# Patient Record
Sex: Female | Born: 1985
Health system: Southern US, Community
[De-identification: ages and names within clinical notes are randomized; demographics above are authoritative.]

## PROBLEM LIST (undated history)

## (undated) ENCOUNTER — Ambulatory Visit (HOSPITAL_COMMUNITY): Admission: EM | Payer: BC Managed Care – PPO

## (undated) ENCOUNTER — Inpatient Hospital Stay (HOSPITAL_COMMUNITY): Payer: Self-pay

## (undated) DIAGNOSIS — F32A Depression, unspecified: Secondary | ICD-10-CM

## (undated) DIAGNOSIS — F329 Major depressive disorder, single episode, unspecified: Secondary | ICD-10-CM

## (undated) DIAGNOSIS — L309 Dermatitis, unspecified: Secondary | ICD-10-CM

## (undated) DIAGNOSIS — Q318 Other congenital malformations of larynx: Secondary | ICD-10-CM

## (undated) DIAGNOSIS — A6000 Herpesviral infection of urogenital system, unspecified: Secondary | ICD-10-CM

## (undated) DIAGNOSIS — D219 Benign neoplasm of connective and other soft tissue, unspecified: Secondary | ICD-10-CM

## (undated) DIAGNOSIS — G4719 Other hypersomnia: Secondary | ICD-10-CM

## (undated) DIAGNOSIS — F419 Anxiety disorder, unspecified: Secondary | ICD-10-CM

## (undated) DIAGNOSIS — K219 Gastro-esophageal reflux disease without esophagitis: Secondary | ICD-10-CM

## (undated) DIAGNOSIS — J45909 Unspecified asthma, uncomplicated: Secondary | ICD-10-CM

## (undated) DIAGNOSIS — F319 Bipolar disorder, unspecified: Secondary | ICD-10-CM

## (undated) HISTORY — DX: Other hypersomnia: G47.19

## (undated) HISTORY — DX: Major depressive disorder, single episode, unspecified: F32.9

## (undated) HISTORY — DX: Depression, unspecified: F32.A

## (undated) HISTORY — DX: Benign neoplasm of connective and other soft tissue, unspecified: D21.9

## (undated) HISTORY — DX: Bipolar disorder, unspecified: F31.9

## (undated) HISTORY — DX: Herpesviral infection of urogenital system, unspecified: A60.00

## (undated) HISTORY — PX: NO PAST SURGERIES: SHX2092

## (undated) HISTORY — DX: Anxiety disorder, unspecified: F41.9

---

## 2013-03-15 ENCOUNTER — Other Ambulatory Visit (HOSPITAL_COMMUNITY)
Admission: RE | Admit: 2013-03-15 | Discharge: 2013-03-15 | Disposition: A | Discharge status: Home / Self Care | Payer: BC Managed Care – PPO | Source: Ambulatory Visit | Attending: Obstetrics & Gynecology | Admitting: Obstetrics & Gynecology

## 2013-03-15 DIAGNOSIS — Z113 Encounter for screening for infections with a predominantly sexual mode of transmission: Secondary | ICD-10-CM | POA: Insufficient documentation

## 2013-03-15 DIAGNOSIS — Z01419 Encounter for gynecological examination (general) (routine) without abnormal findings: Secondary | ICD-10-CM | POA: Insufficient documentation

## 2014-04-08 ENCOUNTER — Other Ambulatory Visit (HOSPITAL_COMMUNITY): Payer: BC Managed Care – PPO

## 2014-04-11 ENCOUNTER — Other Ambulatory Visit (HOSPITAL_COMMUNITY): Payer: Self-pay | Admitting: Family Medicine

## 2014-04-11 ENCOUNTER — Encounter (HOSPITAL_COMMUNITY): Payer: Self-pay | Admitting: *Deleted

## 2014-04-11 ENCOUNTER — Ambulatory Visit (HOSPITAL_COMMUNITY): Payer: BC Managed Care – PPO | Attending: Family Medicine | Admitting: Radiology

## 2014-04-11 DIAGNOSIS — Q2112 Patent foramen ovale: Secondary | ICD-10-CM

## 2014-04-11 DIAGNOSIS — Q211 Atrial septal defect: Secondary | ICD-10-CM

## 2014-04-11 NOTE — Progress Notes (Signed)
Patient ID: Katie [name], female   DOB: [birth year], 28 y.o.   MRN: [id number] 22 G IV started in R hand x1, site un remarkable, for Bubble Study.   Crissie Figures, RN

## 2014-04-11 NOTE — Progress Notes (Signed)
Echocardiogram performed with Bubble Study to rule out PFO.

## 2015-10-30 ENCOUNTER — Ambulatory Visit (INDEPENDENT_AMBULATORY_CARE_PROVIDER_SITE_OTHER): Payer: 59 | Admitting: Gynecology

## 2015-10-30 ENCOUNTER — Encounter: Payer: Self-pay | Admitting: Gynecology

## 2015-10-30 VITALS — BP 120/82 | Ht 64.5 in | Wt 149.0 lb

## 2015-10-30 DIAGNOSIS — Z01419 Encounter for gynecological examination (general) (routine) without abnormal findings: Secondary | ICD-10-CM

## 2015-10-30 NOTE — Patient Instructions (Signed)
Breast Self-Awareness Practicing breast self-awareness may pick up problems early, prevent significant medical complications, and possibly save your life. By practicing breast self-awareness, you can become familiar with how your breasts look and feel and if your breasts are changing. This allows you to notice changes early. It can also offer you some reassurance that your breast health is good. One way to learn what is normal for your breasts and whether your breasts are changing is to do a breast self-exam. If you find a lump or something that was not present in the past, it is best to contact your caregiver right away. Other findings that should be evaluated by your caregiver include nipple discharge, especially if it is bloody; skin changes or reddening; areas where the skin seems to be pulled in (retracted); or new lumps and bumps. Breast pain is seldom associated with cancer (malignancy), but should also be evaluated by a caregiver. HOW TO PERFORM A BREAST SELF-EXAM The best time to examine your breasts is 5-7 days after your menstrual period is over. During menstruation, the breasts are lumpier, and it may be more difficult to pick up changes. If you do not menstruate, have reached menopause, or had your uterus removed (hysterectomy), you should examine your breasts at regular intervals, such as monthly. If you are breastfeeding, examine your breasts after a feeding or after using a breast pump. Breast implants do not decrease the risk for lumps or tumors, so continue to perform breast self-exams as recommended. Talk to your caregiver about how to determine the difference between the implant and breast tissue. Also, talk about the amount of pressure you should use during the exam. Over time, you will become more familiar with the variations of your breasts and more comfortable with the exam. A breast self-exam requires you to remove all your clothes above the waist. 1. Look at your breasts and nipples.  Stand in front of a mirror in a room with good lighting. With your hands on your hips, push your hands firmly downward. Look for a difference in shape, contour, and size from one breast to the other (asymmetry). Asymmetry includes puckers, dips, or bumps. Also, look for skin changes, such as reddened or scaly areas on the breasts. Look for nipple changes, such as discharge, dimpling, repositioning, or redness. 2. Carefully feel your breasts. This is best done either in the shower or tub while using soapy water or when flat on your back. Place the arm (on the side of the breast you are examining) above your head. Use the pads (not the fingertips) of your three middle fingers on your opposite hand to feel your breasts. Start in the underarm area and use  inch (2 cm) overlapping circles to feel your breast. Use 3 different levels of pressure (light, medium, and firm pressure) at each circle before moving to the next circle. The light pressure is needed to feel the tissue closest to the skin. The medium pressure will help to feel breast tissue a little deeper, while the firm pressure is needed to feel the tissue close to the ribs. Continue the overlapping circles, moving downward over the breast until you feel your ribs below your breast. Then, move one finger-width towards the center of the body. Continue to use the  inch (2 cm) overlapping circles to feel your breast as you move slowly up toward the collar bone (clavicle) near the base of the neck. Continue the up and down exam using all 3 pressures until you reach the   middle of the chest. Do this with each breast, carefully feeling for lumps or changes. 3.  Keep a written record with breast changes or normal findings for each breast. By writing this information down, you do not need to depend only on memory for size, tenderness, or location. Write down where you are in your menstrual cycle, if you are still menstruating. Breast tissue can have some lumps or  thick tissue. However, see your caregiver if you find anything that concerns you.  SEEK MEDICAL CARE IF:  You see a change in shape, contour, or size of your breasts or nipples.   You see skin changes, such as reddened or scaly areas on the breasts or nipples.   You have an unusual discharge from your nipples.   You feel a new lump or unusually thick areas.    This information is not intended to replace advice given to you by your health care provider. Make sure you discuss any questions you have with your health care provider.   Document Released: 04/19/2005 Document Revised: 04/05/2012 Document Reviewed: 08/04/2011 Elsevier Interactive Patient Education 2016 [employer]  

## 2015-10-30 NOTE — Progress Notes (Signed)
Katie [name] August 01, 1985 [id number]   History:    30 y.o.  for annual gyn exam who is a new patient to the practice. Patient was receiving her gynecological care in [facility]. Patient denies any past history of any abnormal Pap smear. Her last Pap smear was approximately 2 years ago. We have no records. Patient has never received the HPV vaccine series. She reports normal menstrual cycles every 30 days but they're last for 5 days the first 2 days are heavy with some cramping. She is now attempting to get pregnant. She is currently on prenatal vitamins. Patient had an ultrasound done in [place] in 2016 she was found to have 1 small intramural fibroid measuring 1.4 cm.  Past medical history,surgical history, family history and social history were all reviewed and documented in the EPIC chart.  Gynecologic History Patient's last menstrual period was 10/10/2015. Contraception: none Last Pap: Over 2 years ago.Marland Kitchen Results were: normal Last mammogram: Not indicated. Results were: Not indicated  Obstetric History OB History  Gravida Para Term Preterm AB SAB TAB Ectopic Multiple Living  _0         ROS: A ROS was performed and pertinent positives and negatives are included in the history.  GENERAL: No fevers or chills. HEENT: No change in vision, no earache, sore throat or sinus congestion. NECK: No pain or stiffness. CARDIOVASCULAR: No chest pain or pressure. No palpitations. PULMONARY: No shortness of breath, cough or wheeze. GASTROINTESTINAL: No abdominal pain, nausea, vomiting or diarrhea, melena or bright red blood per rectum. GENITOURINARY: No urinary frequency, urgency, hesitancy or dysuria. MUSCULOSKELETAL: No joint or muscle pain, no back pain, no recent trauma. DERMATOLOGIC: No rash, no itching, no lesions. ENDOCRINE: No polyuria, polydipsia, no heat or cold intolerance. No recent change in weight. HEMATOLOGICAL: No anemia or easy  bruising or bleeding. NEUROLOGIC: No headache, seizures, numbness, tingling or weakness. PSYCHIATRIC: No depression, no loss of interest in normal activity or change in sleep pattern.     Exam: chaperone present  BP 120/82 mmHg  Ht 5' 4.5" (1.638 m)  Wt 149 lb (67.586 kg)  BMI 25.19 kg/m2  LMP 10/10/2015  Body mass index is 25.19 kg/(m^2).  General appearance : Well developed well nourished female. No acute distress HEENT: Eyes: no retinal hemorrhage or exudates,  Neck supple, trachea midline, no carotid bruits, no thyroidmegaly Lungs: Clear to auscultation, no rhonchi or wheezes, or rib retractions  Heart: Regular rate and rhythm, no murmurs or gallops Breast:Examined in sitting and supine position were symmetrical in appearance, no palpable masses or tenderness,  no skin retraction, no nipple inversion, no nipple discharge, no skin discoloration, no axillary or supraclavicular lymphadenopathy Abdomen: no palpable masses or tenderness, no rebound or guarding Extremities: no edema or skin discoloration or tenderness  Pelvic:  Bartholin, Urethra, Skene Glands: Within normal limits             Vagina: No gross lesions or discharge  Cervix: No gross lesions or discharge  Uterus  anteverted, normal size, shape and consistency, non-tender and mobile  Adnexa  Without masses or tenderness  Anus and perineum  normal   Rectovaginal  normal sphincter tone without palpated masses or tenderness             Hemoccult not indicated     Assessment/Plan:  30 y.o. female for annual exam who is attempting to get pregnant. She has has not been using  any contraception for the past month. She is on prenatal vitamins. We discussed the utilization of ovulation predictor kit in an effort to time or intercourse. We also discussed importance of monthly breast exams. She will return to the office sometime next week in a fasting state for the following screening blood work: Comprehensive metabolic panel,  fasting lipid profile, CBC, and urinalysis. Pap smear with HPV screening was done today.   Terrance Mass MD, 3:01 PM 10/30/2015

## 2015-10-31 LAB — URINALYSIS W MICROSCOPIC + REFLEX CULTURE
BACTERIA UA: NONE SEEN [HPF]
BILIRUBIN URINE: NEGATIVE
Casts: NONE SEEN [LPF]
Crystals: NONE SEEN [HPF]
GLUCOSE, UA: NEGATIVE
HGB URINE DIPSTICK: NEGATIVE
LEUKOCYTES UA: NEGATIVE
Nitrite: NEGATIVE
PH: 6 (ref 5.0–8.0)
PROTEIN: NEGATIVE
RBC / HPF: NONE SEEN RBC/HPF (ref <=2)
SQUAMOUS EPITHELIAL / LPF: NONE SEEN [HPF] (ref <=5)
Specific Gravity, Urine: 1.023 (ref 1.001–1.035)
WBC UA: NONE SEEN WBC/HPF (ref <=5)
YEAST: NONE SEEN [HPF]

## 2015-11-03 LAB — PAP, TP IMAGING W/ HPV RNA, RFLX HPV TYPE 16,18/45: HPV mRNA, High Risk: NOT DETECTED

## 2015-11-07 ENCOUNTER — Other Ambulatory Visit: Payer: 59

## 2015-11-20 ENCOUNTER — Other Ambulatory Visit: Payer: 59

## 2015-11-20 LAB — COMPREHENSIVE METABOLIC PANEL
ALT: 9 U/L (ref 6–29)
AST: 13 U/L (ref 10–30)
Albumin: 3.9 g/dL (ref 3.6–5.1)
Alkaline Phosphatase: 63 U/L (ref 33–115)
BILIRUBIN TOTAL: 0.3 mg/dL (ref 0.2–1.2)
BUN: 10 mg/dL (ref 7–25)
CALCIUM: 8.9 mg/dL (ref 8.6–10.2)
CHLORIDE: 104 mmol/L (ref 98–110)
CO2: 26 mmol/L (ref 20–31)
Creat: 0.74 mg/dL (ref 0.50–1.10)
GLUCOSE: 93 mg/dL (ref 65–99)
POTASSIUM: 4.6 mmol/L (ref 3.5–5.3)
Sodium: 137 mmol/L (ref 135–146)
Total Protein: 6.7 g/dL (ref 6.1–8.1)

## 2015-11-20 LAB — CBC WITH DIFFERENTIAL/PLATELET
Basophils Absolute: 0 cells/uL (ref 0–200)
Basophils Relative: 0 %
Eosinophils Absolute: 0 cells/uL — ABNORMAL LOW (ref 15–500)
Eosinophils Relative: 0 %
HEMATOCRIT: 39.4 % (ref 35.0–45.0)
Hemoglobin: 13 g/dL (ref 11.7–15.5)
LYMPHS PCT: 30 %
Lymphs Abs: 2010 cells/uL (ref 850–3900)
MCH: 31.1 pg (ref 27.0–33.0)
MCHC: 33 g/dL (ref 32.0–36.0)
MCV: 94.3 fL (ref 80.0–100.0)
MONO ABS: 402 {cells}/uL (ref 200–950)
MPV: 9 fL (ref 7.5–12.5)
Monocytes Relative: 6 %
Neutro Abs: 4288 cells/uL (ref 1500–7800)
Neutrophils Relative %: 64 %
Platelets: 354 10*3/uL (ref 140–400)
RBC: 4.18 MIL/uL (ref 3.80–5.10)
RDW: 12.8 % (ref 11.0–15.0)
WBC: 6.7 10*3/uL (ref 3.8–10.8)

## 2015-11-20 LAB — LIPID PANEL
CHOL/HDL RATIO: 2.9 ratio (ref <=5.0)
Cholesterol: 160 mg/dL (ref 125–200)
HDL: 56 mg/dL (ref >=46)
LDL CALC: 89 mg/dL (ref <130)
TRIGLYCERIDES: 77 mg/dL (ref <150)
VLDL: 15 mg/dL (ref <30)

## 2015-12-03 ENCOUNTER — Telehealth: Payer: Self-pay | Admitting: *Deleted

## 2015-12-03 MED ORDER — ACYCLOVIR 400 MG PO TABS: 400.0000 mg | ORAL_TABLET | Freq: Every day | ORAL | 11 refills | Status: DC

## 2015-12-03 MED ORDER — METAXALONE 800 MG PO TABS: 800.0000 mg | ORAL_TABLET | Freq: Three times a day (TID) | ORAL | 6 refills | Status: DC

## 2015-12-03 MED ORDER — KETOPROFEN 75 MG PO CAPS: 75.0000 mg | ORAL_CAPSULE | Freq: Four times a day (QID) | ORAL | 3 refills | Status: DC | PRN

## 2015-12-03 NOTE — Telephone Encounter (Signed)
Pt informed and Rxs sent KW

## 2015-12-03 NOTE — Telephone Encounter (Signed)
Pt saw you in June as a New Patient. Medications were not refilled at that time. She takes Acyclovir daily for suppression.  She takes Ketoprofen and Metaxalone during her menstrual cycle when the cramps are severe. Ok to refill? Pls Advise KW CMA

## 2015-12-03 NOTE — Telephone Encounter (Signed)
yES [place]

## 2016-05-03 NOTE — L&D Delivery Note (Signed)
Delivery Note At 12:29 AM a viable female was delivered via Vaginal, Spontaneous Delivery (PresentationVTX, OA: ;  ).  APGAR: , ; weight  .   Placenta statusspont>>intact: , .  Cord:  with the following complications: .  Cord pH: not sent  Anesthesia:  loc Episiotomy: none  Lacerations:  R periurethral + small sec deg perineal Suture Repair: 3.0 vicryl rapide Est. Blood Loss (mL):  300  Mom to postpartum.  Baby to Nursery.  Margarette Asal 01/20/2017, 12:47 AM

## 2016-07-20 LAB — OB RESULTS CONSOLE HEPATITIS B SURFACE ANTIGEN: Hepatitis B Surface Ag: NEGATIVE

## 2016-07-20 LAB — OB RESULTS CONSOLE RPR: RPR: NONREACTIVE

## 2016-07-20 LAB — OB RESULTS CONSOLE RUBELLA ANTIBODY, IGM: Rubella: IMMUNE

## 2016-07-20 LAB — OB RESULTS CONSOLE HIV ANTIBODY (ROUTINE TESTING): HIV: NONREACTIVE

## 2016-07-20 LAB — OB RESULTS CONSOLE GC/CHLAMYDIA
Chlamydia: NEGATIVE
GC PROBE AMP, GENITAL: NEGATIVE

## 2016-07-20 LAB — OB RESULTS CONSOLE GBS: GBS: POSITIVE

## 2016-08-05 ENCOUNTER — Other Ambulatory Visit: Payer: Self-pay | Admitting: Obstetrics and Gynecology

## 2016-08-05 ENCOUNTER — Other Ambulatory Visit (HOSPITAL_COMMUNITY)
Admission: RE | Admit: 2016-08-05 | Discharge: 2016-08-05 | Disposition: A | Discharge status: Home / Self Care | Payer: 59 | Source: Ambulatory Visit | Attending: Obstetrics and Gynecology | Admitting: Obstetrics and Gynecology

## 2016-08-05 DIAGNOSIS — Z1151 Encounter for screening for human papillomavirus (HPV): Secondary | ICD-10-CM | POA: Diagnosis present

## 2016-08-05 DIAGNOSIS — Z113 Encounter for screening for infections with a predominantly sexual mode of transmission: Secondary | ICD-10-CM | POA: Diagnosis present

## 2016-08-05 DIAGNOSIS — Z01419 Encounter for gynecological examination (general) (routine) without abnormal findings: Secondary | ICD-10-CM | POA: Insufficient documentation

## 2016-08-10 LAB — CYTOLOGY - PAP
Chlamydia: NEGATIVE
DIAGNOSIS: NEGATIVE
HPV: NOT DETECTED
Neisseria Gonorrhea: NEGATIVE

## 2016-09-15 ENCOUNTER — Encounter: Payer: Self-pay | Admitting: Gynecology

## 2016-10-04 ENCOUNTER — Encounter (HOSPITAL_COMMUNITY): Payer: Self-pay | Admitting: *Deleted

## 2016-10-04 ENCOUNTER — Inpatient Hospital Stay (HOSPITAL_COMMUNITY)
Admission: AD | Admit: 2016-10-04 | Discharge: 2016-10-04 | Disposition: A | Discharge status: Home / Self Care | Payer: 59 | Source: Ambulatory Visit | Attending: Obstetrics and Gynecology | Admitting: Obstetrics and Gynecology

## 2016-10-04 DIAGNOSIS — K529 Noninfective gastroenteritis and colitis, unspecified: Secondary | ICD-10-CM | POA: Diagnosis not present

## 2016-10-04 DIAGNOSIS — O99612 Diseases of the digestive system complicating pregnancy, second trimester: Secondary | ICD-10-CM | POA: Diagnosis not present

## 2016-10-04 DIAGNOSIS — F419 Anxiety disorder, unspecified: Secondary | ICD-10-CM | POA: Insufficient documentation

## 2016-10-04 DIAGNOSIS — Z8249 Family history of ischemic heart disease and other diseases of the circulatory system: Secondary | ICD-10-CM | POA: Insufficient documentation

## 2016-10-04 DIAGNOSIS — Z79899 Other long term (current) drug therapy: Secondary | ICD-10-CM | POA: Diagnosis not present

## 2016-10-04 DIAGNOSIS — R112 Nausea with vomiting, unspecified: Secondary | ICD-10-CM | POA: Diagnosis present

## 2016-10-04 DIAGNOSIS — F319 Bipolar disorder, unspecified: Secondary | ICD-10-CM | POA: Insufficient documentation

## 2016-10-04 DIAGNOSIS — Z3A18 18 weeks gestation of pregnancy: Secondary | ICD-10-CM | POA: Diagnosis not present

## 2016-10-04 DIAGNOSIS — O99342 Other mental disorders complicating pregnancy, second trimester: Secondary | ICD-10-CM | POA: Diagnosis not present

## 2016-10-04 DIAGNOSIS — O9989 Other specified diseases and conditions complicating pregnancy, childbirth and the puerperium: Secondary | ICD-10-CM | POA: Diagnosis not present

## 2016-10-04 LAB — URINALYSIS, ROUTINE W REFLEX MICROSCOPIC
Bilirubin Urine: NEGATIVE
Glucose, UA: NEGATIVE mg/dL
Hgb urine dipstick: NEGATIVE
KETONES UR: NEGATIVE mg/dL
LEUKOCYTES UA: NEGATIVE
NITRITE: NEGATIVE
PROTEIN: NEGATIVE mg/dL
Specific Gravity, Urine: 1.027 (ref 1.005–1.030)
pH: 5 (ref 5.0–8.0)

## 2016-10-04 LAB — CBC WITH DIFFERENTIAL/PLATELET
BASOS ABS: 0 10*3/uL (ref 0.0–0.1)
BASOS PCT: 0 %
EOS PCT: 0 %
Eosinophils Absolute: 0 10*3/uL (ref 0.0–0.7)
HEMATOCRIT: 36.3 % (ref 36.0–46.0)
Hemoglobin: 12.5 g/dL (ref 12.0–15.0)
LYMPHS PCT: 10 %
Lymphs Abs: 1.2 10*3/uL (ref 0.7–4.0)
MCH: 31.2 pg (ref 26.0–34.0)
MCHC: 34.4 g/dL (ref 30.0–36.0)
MCV: 90.5 fL (ref 78.0–100.0)
MONO ABS: 0.4 10*3/uL (ref 0.1–1.0)
MONOS PCT: 4 %
NEUTROS ABS: 10 10*3/uL — AB (ref 1.7–7.7)
Neutrophils Relative %: 86 %
PLATELETS: 306 10*3/uL (ref 150–400)
RBC: 4.01 MIL/uL (ref 3.87–5.11)
RDW: 12.3 % (ref 11.5–15.5)
WBC: 11.7 10*3/uL — ABNORMAL HIGH (ref 4.0–10.5)

## 2016-10-04 LAB — COMPREHENSIVE METABOLIC PANEL
ALT: 29 U/L (ref 14–54)
ANION GAP: 10 (ref 5–15)
AST: 36 U/L (ref 15–41)
Albumin: 2.7 g/dL — ABNORMAL LOW (ref 3.5–5.0)
Alkaline Phosphatase: 57 U/L (ref 38–126)
BILIRUBIN TOTAL: 0.5 mg/dL (ref 0.3–1.2)
BUN: 7 mg/dL (ref 6–20)
CHLORIDE: 103 mmol/L (ref 101–111)
CO2: 21 mmol/L — ABNORMAL LOW (ref 22–32)
Calcium: 8.8 mg/dL — ABNORMAL LOW (ref 8.9–10.3)
Creatinine, Ser: 0.42 mg/dL — ABNORMAL LOW (ref 0.44–1.00)
GFR calc Af Amer: >60 mL/min (ref >60)
Glucose, Bld: 107 mg/dL — ABNORMAL HIGH (ref 65–99)
POTASSIUM: 4.3 mmol/L (ref 3.5–5.1)
Sodium: 134 mmol/L — ABNORMAL LOW (ref 135–145)
Total Protein: 6.2 g/dL — ABNORMAL LOW (ref 6.5–8.1)

## 2016-10-04 MED ORDER — FAMOTIDINE IN NACL 20-0.9 MG/50ML-% IV SOLN
20.0000 mg | Freq: Once | INTRAVENOUS | Status: AC
  Administered 2016-10-04: 20 mg via INTRAVENOUS
  Filled 2016-10-04: qty 50

## 2016-10-04 MED ORDER — ONDANSETRON 4 MG PO TBDP: 4.0000 mg | ORAL_TABLET | Freq: Three times a day (TID) | ORAL | 0 refills | Status: DC | PRN

## 2016-10-04 MED ORDER — PROMETHAZINE HCL 12.5 MG PO TABS: 12.5000 mg | ORAL_TABLET | Freq: Four times a day (QID) | ORAL | 0 refills | Status: DC | PRN

## 2016-10-04 MED ORDER — LACTATED RINGERS IV BOLUS (SEPSIS)
1000.0000 mL | Freq: Once | INTRAVENOUS | Status: AC
  Administered 2016-10-04: 1000 mL via INTRAVENOUS

## 2016-10-04 MED ORDER — SODIUM CHLORIDE 0.9 % IV SOLN
8.0000 mg | Freq: Once | INTRAVENOUS | Status: AC
  Administered 2016-10-04: 8 mg via INTRAVENOUS
  Filled 2016-10-04: qty 4

## 2016-10-04 MED ORDER — DEXTROSE 5 % IN LACTATED RINGERS IV BOLUS
1000.0000 mL | Freq: Once | INTRAVENOUS | Status: AC
  Administered 2016-10-04: 1000 mL via INTRAVENOUS

## 2016-10-04 NOTE — MAU Provider Note (Signed)
History     CSN: [id number]  Arrival date and time: 10/04/16 1125   First Provider Initiated Contact with Patient 10/04/16 1239      Chief Complaint  Patient presents with  . Nausea  . Emesis  . Diarrhea   HPI   Ms.Katie [name] is a 31 y.o. female G1P0000 @ 69w5dhere with nausea, vomiting, diarrhea and fever. Symptom started within the last 24 hours. She attests to 6-7 episodes of diarrhea and one episode of vomiting. No one else In the house is sick. She has not eaten any fast food. She has not had any vomiting in this pregnancy. This is a new symptom.   OB History    Gravida Para Term Preterm AB Living   1 0 0 0 0 0   SAB TAB Ectopic Multiple Live Births   0 0 0 0        Past Medical History:  Diagnosis Date  . Anxiety   . Bipolar 1 disorder ([place]   . Depression   . Fibroid   . Genital herpes     No past surgical history on file.  Family History  Problem Relation Age of Onset  . Arrhythmia Mother   . Hypertension Father   . Arrhythmia Maternal Grandmother   . Diabetes Paternal Grandmother   . Hypertension Paternal Grandmother   . Diabetes Paternal Grandfather   . Hypertension Paternal Grandfather     Social History  Substance Use Topics  . Smoking status: Never Smoker  . Smokeless tobacco: Not on file  . Alcohol use 0.0 oz/week     Comment: SOCIAL     Allergies: No Known Allergies  Prescriptions Prior to Admission  Medication Sig Dispense Refill Last Dose  . acyclovir (ZOVIRAX) 400 MG tablet Take 1 tablet (400 mg total) by mouth 5 (five) times daily. 30 tablet 11   . ALPRAZolam (XANAX) 0.25 MG tablet Take 0.25 mg by mouth at bedtime as needed for anxiety.     . ARIPiprazole (ABILIFY) 10 MG tablet Take 10 mg by mouth daily.   Taking  . HYDROcodone-acetaminophen (NORCO/VICODIN) 5-325 MG tablet Take 1 tablet by mouth every 6 (six) hours as needed for moderate pain.   Taking  . ketoprofen (ORUDIS) 75 MG capsule Take 1 capsule (75 mg total) by mouth  4 (four) times daily as needed. 30 capsule 3   . lamoTRIgine (LAMICTAL) 200 MG tablet Take 200 mg by mouth daily.   Taking  . metaxalone (SKELAXIN) 800 MG tablet Take 1 tablet (800 mg total) by mouth 3 (three) times daily. 30 tablet 6    Results for orders placed or performed during the hospital encounter of 10/04/16 (from the past 48 hour(s))  Urinalysis, Routine w reflex microscopic     Status: None   Collection Time: 10/04/16 11:52 AM  Result Value Ref Range   Color, Urine YELLOW YELLOW   APPearance CLEAR CLEAR   Specific Gravity, Urine 1.027 1.005 - 1.030   pH 5.0 5.0 - 8.0   Glucose, UA NEGATIVE NEGATIVE mg/dL   Hgb urine dipstick NEGATIVE NEGATIVE   Bilirubin Urine NEGATIVE NEGATIVE   Ketones, ur NEGATIVE NEGATIVE mg/dL   Protein, ur NEGATIVE NEGATIVE mg/dL   Nitrite NEGATIVE NEGATIVE   Leukocytes, UA NEGATIVE NEGATIVE  CBC with Differential     Status: Abnormal   Collection Time: 10/04/16 12:43 PM  Result Value Ref Range   WBC 11.7 (H) 4.0 - 10.5 K/uL   RBC 4.01 3.87 - 5.11  MIL/uL   Hemoglobin 12.5 12.0 - 15.0 g/dL   HCT 36.3 36.0 - 46.0 %   MCV 90.5 78.0 - 100.0 fL   MCH 31.2 26.0 - 34.0 pg   MCHC 34.4 30.0 - 36.0 g/dL   RDW 12.3 11.5 - 15.5 %   Platelets 306 150 - 400 K/uL   Neutrophils Relative % 86 %   Neutro Abs 10.0 (H) 1.7 - 7.7 K/uL   Lymphocytes Relative 10 %   Lymphs Abs 1.2 0.7 - 4.0 K/uL   Monocytes Relative 4 %   Monocytes Absolute 0.4 0.1 - 1.0 K/uL   Eosinophils Relative 0 %   Eosinophils Absolute 0.0 0.0 - 0.7 K/uL   Basophils Relative 0 %   Basophils Absolute 0.0 0.0 - 0.1 K/uL  Comprehensive metabolic panel     Status: Abnormal   Collection Time: 10/04/16 12:43 PM  Result Value Ref Range   Sodium 134 (L) 135 - 145 mmol/L   Potassium 4.3 3.5 - 5.1 mmol/L   Chloride 103 101 - 111 mmol/L   CO2 21 (L) 22 - 32 mmol/L   Glucose, Bld 107 (H) 65 - 99 mg/dL   BUN 7 6 - 20 mg/dL   Creatinine, Ser 0.42 (L) 0.44 - 1.00 mg/dL   Calcium 8.8 (L) 8.9 - 10.3  mg/dL   Total Protein 6.2 (L) 6.5 - 8.1 g/dL   Albumin 2.7 (L) 3.5 - 5.0 g/dL   AST 36 15 - 41 U/L   ALT 29 14 - 54 U/L   Alkaline Phosphatase 57 38 - 126 U/L   Total Bilirubin 0.5 0.3 - 1.2 mg/dL   GFR calc non Af Amer >60 >60 mL/min   GFR calc Af Amer >60 >60 mL/min    Comment: (NOTE) The eGFR has been calculated using the CKD EPI equation. This calculation has not been validated in all clinical situations. eGFR's persistently <60 mL/min signify possible Chronic Kidney Disease.    Anion gap 10 5 - 15    Review of Systems  Constitutional: Negative for fever.  Gastrointestinal: Positive for diarrhea, nausea and vomiting.   Physical Exam   Blood pressure 119/72, pulse 98, temperature 98.2 F (36.8 C), resp. rate 18, weight 155 lb (70.3 kg), last menstrual period 05/26/2016, SpO2 97 %.  Physical Exam  Constitutional: She is oriented to person, place, and time. She appears well-developed and well-nourished. No distress.  HENT:  Head: Normocephalic.  Eyes: Pupils are equal, round, and reactive to light.  Respiratory: Effort normal.  GI: Soft.  Musculoskeletal: Normal range of motion.  Neurological: She is alert and oriented to person, place, and time.  Skin: Skin is warm. She is not diaphoretic.  Psychiatric: Her behavior is normal.   MAU Course  Procedures  None  MDM  + fetal heart tones via doppler.  LR bolus X 1 D5LR bolus x1 CBC & CMP Zofran 8 mg IV Pepcid 20 mg IV Patient tolerating fluids and crackers.  Discussed patient with Dr. Julien Girt. [place] for discharge.   Assessment and Plan   A:  1. Gastroenteritis, acute      P:  Discharge home in stable condition  Rx: Zofran, Phenergan  Return to MAU as needed, if symptoms worsen  BRAT diet Increase PO fluids    Ryenn Howeth, Artist Pais, NP 10/04/2016 3:36 PM

## 2016-10-04 NOTE — Discharge Instructions (Signed)

## 2016-10-04 NOTE — MAU Note (Addendum)
Pt presents to MAU with complaints of N/V/D and fever since yesterday. Denies any pain at this time. Denies any VB or abnormal discharge. Pt started her care at [facility] but has recently switched to [facility]

## 2016-12-01 DIAGNOSIS — Z23 Encounter for immunization: Secondary | ICD-10-CM | POA: Diagnosis not present

## 2016-12-29 ENCOUNTER — Other Ambulatory Visit: Payer: Self-pay

## 2016-12-31 ENCOUNTER — Other Ambulatory Visit: Payer: Self-pay | Admitting: *Deleted

## 2016-12-31 MED ORDER — ACYCLOVIR 400 MG PO TABS: 400.0000 mg | ORAL_TABLET | Freq: Every day | ORAL | 1 refills | Status: DC

## 2017-01-10 ENCOUNTER — Inpatient Hospital Stay (HOSPITAL_COMMUNITY)
Admission: AD | Admit: 2017-01-10 | Discharge: 2017-01-13 | DRG: 778 | Disposition: A | Discharge status: Home / Self Care | Payer: 59 | Source: Ambulatory Visit | Attending: Obstetrics & Gynecology | Admitting: Obstetrics & Gynecology

## 2017-01-10 ENCOUNTER — Encounter (HOSPITAL_COMMUNITY): Payer: Self-pay

## 2017-01-10 DIAGNOSIS — O4703 False labor before 37 completed weeks of gestation, third trimester: Secondary | ICD-10-CM

## 2017-01-10 DIAGNOSIS — A6000 Herpesviral infection of urogenital system, unspecified: Secondary | ICD-10-CM | POA: Diagnosis present

## 2017-01-10 DIAGNOSIS — O4693 Antepartum hemorrhage, unspecified, third trimester: Secondary | ICD-10-CM

## 2017-01-10 DIAGNOSIS — Z6791 Unspecified blood type, Rh negative: Secondary | ICD-10-CM

## 2017-01-10 DIAGNOSIS — F319 Bipolar disorder, unspecified: Secondary | ICD-10-CM | POA: Diagnosis present

## 2017-01-10 DIAGNOSIS — Z3A32 32 weeks gestation of pregnancy: Secondary | ICD-10-CM

## 2017-01-10 DIAGNOSIS — O99343 Other mental disorders complicating pregnancy, third trimester: Secondary | ICD-10-CM | POA: Diagnosis present

## 2017-01-10 DIAGNOSIS — O98313 Other infections with a predominantly sexual mode of transmission complicating pregnancy, third trimester: Secondary | ICD-10-CM | POA: Diagnosis present

## 2017-01-10 DIAGNOSIS — O26893 Other specified pregnancy related conditions, third trimester: Secondary | ICD-10-CM | POA: Diagnosis present

## 2017-01-10 LAB — URINALYSIS, ROUTINE W REFLEX MICROSCOPIC
BILIRUBIN URINE: NEGATIVE
Bacteria, UA: NONE SEEN
Glucose, UA: NEGATIVE mg/dL
Ketones, ur: NEGATIVE mg/dL
LEUKOCYTES UA: NEGATIVE
NITRITE: NEGATIVE
PH: 6 (ref 5.0–8.0)
Protein, ur: NEGATIVE mg/dL
RBC / HPF: NONE SEEN RBC/hpf (ref 0–5)
Specific Gravity, Urine: 1.021 (ref 1.005–1.030)

## 2017-01-10 LAB — CBC
HEMATOCRIT: 37.8 % (ref 36.0–46.0)
Hemoglobin: 12.8 g/dL (ref 12.0–15.0)
MCH: 31.8 pg (ref 26.0–34.0)
MCHC: 33.9 g/dL (ref 30.0–36.0)
MCV: 93.8 fL (ref 78.0–100.0)
PLATELETS: 304 10*3/uL (ref 150–400)
RBC: 4.03 MIL/uL (ref 3.87–5.11)
RDW: 13.3 % (ref 11.5–15.5)
WBC: 13.4 10*3/uL — AB (ref 4.0–10.5)

## 2017-01-10 MED ORDER — PRENATAL MULTIVITAMIN CH
1.0000 | ORAL_TABLET | Freq: Every day | ORAL | Status: DC
  Administered 2017-01-11 – 2017-01-12 (×2): 1 via ORAL
  Filled 2017-01-10 (×2): qty 1

## 2017-01-10 MED ORDER — LAMOTRIGINE 200 MG PO TABS
200.0000 mg | ORAL_TABLET | Freq: Every day | ORAL | Status: DC
  Administered 2017-01-11 – 2017-01-13 (×3): 200 mg via ORAL
  Filled 2017-01-10 (×3): qty 1

## 2017-01-10 MED ORDER — ACYCLOVIR 400 MG PO TABS
400.0000 mg | ORAL_TABLET | Freq: Every day | ORAL | Status: DC
  Administered 2017-01-10 – 2017-01-13 (×4): 400 mg via ORAL
  Filled 2017-01-10 (×4): qty 1

## 2017-01-10 MED ORDER — BETAMETHASONE SOD PHOS & ACET 6 (3-3) MG/ML IJ SUSP
12.0000 mg | Freq: Once | INTRAMUSCULAR | Status: AC
  Administered 2017-01-11: 12 mg via INTRAMUSCULAR
  Filled 2017-01-10: qty 2

## 2017-01-10 MED ORDER — ACETAMINOPHEN 325 MG PO TABS: 650.0000 mg | ORAL_TABLET | ORAL | Status: DC | PRN

## 2017-01-10 MED ORDER — ZOLPIDEM TARTRATE 5 MG PO TABS: 5.0000 mg | ORAL_TABLET | Freq: Every evening | ORAL | Status: DC | PRN

## 2017-01-10 MED ORDER — ARIPIPRAZOLE 10 MG PO TABS
20.0000 mg | ORAL_TABLET | Freq: Every day | ORAL | Status: DC
  Administered 2017-01-10 – 2017-01-12 (×3): 20 mg via ORAL
  Filled 2017-01-10 (×3): qty 2

## 2017-01-10 MED ORDER — ACYCLOVIR 400 MG PO TABS: 400.0000 mg | ORAL_TABLET | Freq: Every day | ORAL | Status: DC

## 2017-01-10 MED ORDER — MAGNESIUM SULFATE BOLUS VIA INFUSION
6.0000 g | Freq: Once | INTRAVENOUS | Status: AC
  Administered 2017-01-10: 6 g via INTRAVENOUS
  Filled 2017-01-10: qty 500

## 2017-01-10 MED ORDER — DOCUSATE SODIUM 100 MG PO CAPS
100.0000 mg | ORAL_CAPSULE | Freq: Every day | ORAL | Status: DC
  Administered 2017-01-11 – 2017-01-13 (×4): 100 mg via ORAL
  Filled 2017-01-10 (×3): qty 1

## 2017-01-10 MED ORDER — BETAMETHASONE SOD PHOS & ACET 6 (3-3) MG/ML IJ SUSP
12.0000 mg | INTRAMUSCULAR | Status: DC
  Filled 2017-01-10: qty 2

## 2017-01-10 MED ORDER — LACTATED RINGERS IV SOLN
INTRAVENOUS | Status: DC
  Administered 2017-01-10 – 2017-01-12 (×4): via INTRAVENOUS

## 2017-01-10 MED ORDER — CALCIUM CARBONATE ANTACID 500 MG PO CHEW: 2.0000 | CHEWABLE_TABLET | ORAL | Status: DC | PRN

## 2017-01-10 MED ORDER — BETAMETHASONE SOD PHOS & ACET 6 (3-3) MG/ML IJ SUSP
12.0000 mg | Freq: Once | INTRAMUSCULAR | Status: AC
  Administered 2017-01-10: 12 mg via INTRAMUSCULAR
  Filled 2017-01-10: qty 2

## 2017-01-10 MED ORDER — MAGNESIUM SULFATE 40 G IN LACTATED RINGERS - SIMPLE
2.0000 g/h | INTRAVENOUS | Status: DC
  Administered 2017-01-10 – 2017-01-11 (×2): 2 g/h via INTRAVENOUS
  Filled 2017-01-10 (×2): qty 40

## 2017-01-10 NOTE — MAU Note (Addendum)
Pt sent from office for vaginal bleeding. Ruled out abruption with ultrasound. Sent for observation. Was dilated 2cm. Pt feels tightening of ctx but not painful

## 2017-01-10 NOTE — H&P (Signed)
Arthea Nobel is a 31 y.o. female G1 at [redacted]w[redacted]d presenting for evaluation of preterm labor.  She presented to the office today with c/o vaginal bleeding with wiping and SVE 2/c/+2.  Ultrasound: EFW 4#4, AFI 17.1, cervical length 2.3. Placenta posterior with no sign of abruption. Vertex presentation.  She was sent to MAU for further evaluation.  She received her first dose of BMZ at 1715.  CTX q 6-8 were noted with Cat I fetal tracing.  Cervical exam was unchanged. Antepartum course complicated by Bipolar Disorder, HSV.  She is RH neg and received rhogam 8/1.    OB History    Gravida Para Term Preterm AB Living   1 0 0 0 0 0   SAB TAB Ectopic Multiple Live Births   0 0 0 0       Past Medical History:  Diagnosis Date  . Anxiety   . Bipolar 1 disorder ([place])   . Depression   . Fibroid   . Genital herpes    Past Surgical History:  Procedure Laterality Date  . NO PAST SURGERIES     Family History: family history includes Arrhythmia in her maternal grandmother and mother; Diabetes in her paternal grandfather and paternal grandmother; Hypertension in her father, paternal grandfather, and paternal grandmother. Social History:  reports that she has never smoked. She has never used smokeless tobacco. She reports that she drinks alcohol. Her drug history is not on file.     Maternal Diabetes: No Genetic Screening: Normal Maternal Ultrasounds/Referrals: Normal Fetal Ultrasounds or other Referrals:  None Maternal Substance Abuse:  No Significant Maternal Medications:  Meds include: Other: Valtrex, Lamictal, and Abilify Significant Maternal Lab Results:  None Other Comments:  None  ROS Maternal Medical History:  Reason for admission: Vaginal bleeding.   Contractions: Onset was 6-12 hours ago.   Frequency: irregular.   Perceived severity is mild.    Fetal activity: Perceived fetal activity is normal.   Last perceived fetal movement was within the past hour.    Prenatal complications:  Bleeding.   Prenatal Complications - Diabetes: none.    Dilation: 2 Effacement (%): 70 Station: -2 Exam by:: E Lawrence NP Blood pressure 118/68, pulse 86, temperature 98.3 F (36.8 C), temperature source Oral, resp. rate 18, last menstrual period 05/26/2016, SpO2 99 %. Maternal Exam:  Uterine Assessment: Contraction strength is mild.  Contraction frequency is rare.   Abdomen: Patient reports no abdominal tenderness. Fundal height is c/w dates.   Estimated fetal weight is 4#.       Physical Exam  Constitutional: She is oriented to person, place, and time. She appears well-developed and well-nourished.  GI: Soft. There is no rebound and no guarding.  Neurological: She is alert and oriented to person, place, and time.  Skin: Skin is warm and dry.  Psychiatric: She has a normal mood and affect. Her behavior is normal.    Prenatal labs: ABO, Rh:   Antibody:   Rubella:   RPR:    HBsAg:    HIV:    GBS:     Assessment/Plan: 30yo G1 at [redacted]w[redacted]d admitted to rule out preterm labor -BMZ -Mag 6/2 -SCDs   Caiya Bettes 01/10/2017, 8:14 PM

## 2017-01-10 NOTE — MAU Provider Note (Signed)
History     CSN: [id number]  Arrival date and time: 01/10/17 1642  First Provider Initiated Contact with Patient 01/10/17 1803      Chief Complaint  Patient presents with  . Vaginal Bleeding   HPI Katie [name] is a 31 y.o. G1P0000 at [redacted]w[redacted]d who presents from the office for vaginal bleeding. Was seen in office this afternoon by Dr. Royston Sinner. Patient reports dark red spotting on toilet paper today. Feels abdominal tightening intermittently. Denies abdominal pain, LOF, dysuria, vomiting, diarrhea, constipation, or recent intercourse. In office had ultrasound without evidence of abruption. Cervical exam in office was 2 cm dilated.   OB History    Gravida Para Term Preterm AB Living   1 0 0 0 0 0   SAB TAB Ectopic Multiple Live Births   0 0 0 0        Past Medical History:  Diagnosis Date  . Anxiety   . Bipolar 1 disorder ([place])   . Depression   . Fibroid   . Genital herpes     Past Surgical History:  Procedure Laterality Date  . NO PAST SURGERIES      Family History  Problem Relation Age of Onset  . Arrhythmia Mother   . Hypertension Father   . Arrhythmia Maternal Grandmother   . Diabetes Paternal Grandmother   . Hypertension Paternal Grandmother   . Diabetes Paternal Grandfather   . Hypertension Paternal Grandfather     Social History  Substance Use Topics  . Smoking status: Never Smoker  . Smokeless tobacco: Never Used  . Alcohol use 0.0 oz/week     Comment: SOCIAL     Allergies: No Known Allergies  Prescriptions Prior to Admission  Medication Sig Dispense Refill Last Dose  . acyclovir (ZOVIRAX) 400 MG tablet Take 1 tablet (400 mg total) by mouth 5 (five) times daily. (Patient taking differently: Take 400 mg by mouth daily. ) 30 tablet 1 01/09/2017 at Unknown time  . ARIPiprazole (ABILIFY) 20 MG tablet Take 20 mg by mouth at bedtime.   01/09/2017 at Unknown time  . lamoTRIgine (LAMICTAL) 200 MG tablet Take 200 mg by mouth daily.   01/10/2017 at Unknown time  .  omega-3 acid ethyl esters (LOVAZA) 1 g capsule Take 1 g by mouth daily.   01/09/2017 at Unknown time  . Prenatal Vit-Fe Fumarate-FA (PRENATAL MULTIVITAMIN) TABS tablet Take 1 tablet by mouth at bedtime.   01/09/2017 at Unknown time  . ondansetron (ZOFRAN ODT) 4 MG disintegrating tablet Take 1 tablet (4 mg total) by mouth every 8 (eight) hours as needed for nausea or vomiting. (Patient not taking: Reported on 01/10/2017) 20 tablet 0 Not Taking at Unknown time  . promethazine (PHENERGAN) 12.5 MG tablet Take 1 tablet (12.5 mg total) by mouth every 6 (six) hours as needed for nausea or vomiting. (Patient not taking: Reported on 01/10/2017) 30 tablet 0 Not Taking at Unknown time    Review of Systems  Constitutional: Negative.   Gastrointestinal: Negative.   Genitourinary: Positive for vaginal bleeding. Negative for dysuria and vaginal discharge.   Physical Exam   Blood pressure 118/68, pulse 86, temperature 98.3 F (36.8 C), temperature source Oral, resp. rate 16, last menstrual period 05/26/2016.  Physical Exam  Nursing note and vitals reviewed. Constitutional: She is oriented to person, place, and time. She appears well-developed and well-nourished. No distress.  HENT:  Head: Normocephalic and atraumatic.  Eyes: Conjunctivae are normal. Right eye exhibits no discharge. Left eye exhibits no discharge. No  scleral icterus.  Neck: Normal range of motion.  Respiratory: Effort normal. No respiratory distress.  GI: Soft. There is no tenderness.  Ctx palpate mild   Genitourinary:  Genitourinary Comments: Small amount of rust colored blood on glove with SVE  Neurological: She is alert and oriented to person, place, and time.  Skin: Skin is warm and dry. She is not diaphoretic.  Psychiatric: She has a normal mood and affect. Her behavior is normal. Judgment and thought content normal.   Dilation: 2 Effacement (%): 70 Cervical Position: Posterior Station: -2 Exam by:: Robyne Askew NP  Fetal  Tracing:  Baseline: 135 Variability: moderate Accelerations: 15x15 Decelerations: none  Toco: Q5-8 mins, palpate mild MAU Course  Procedures Results for orders placed or performed during the hospital encounter of 01/10/17 (from the past 48 hour(s))  Urinalysis, Routine w reflex microscopic     Status: Abnormal   Collection Time: 01/10/17  4:50 PM  Result Value Ref Range   Color, Urine YELLOW YELLOW   APPearance CLEAR CLEAR   Specific Gravity, Urine 1.021 1.005 - 1.030   pH 6.0 5.0 - 8.0   Glucose, UA NEGATIVE NEGATIVE mg/dL   Hgb urine dipstick SMALL (A) NEGATIVE   Bilirubin Urine NEGATIVE NEGATIVE   Ketones, ur NEGATIVE NEGATIVE mg/dL   Protein, ur NEGATIVE NEGATIVE mg/dL   Nitrite NEGATIVE NEGATIVE   Leukocytes, UA NEGATIVE NEGATIVE   RBC / HPF NONE SEEN 0 - 5 RBC/hpf   WBC, UA 0-5 0 - 5 WBC/hpf   Bacteria, UA NONE SEEN NONE SEEN   Squamous Epithelial / LPF 0-5 (A) NONE SEEN   Mucus PRESENT     MDM Reactive NST. Ctx noted on TOCO every 6-8 minutes. Cervix checked, 2/70/-2 posterior with small amount of rust colored blood noted on glove.  RH negative -- rhogham given in office at 28 wks.  BMZ given S/w Dr. Lynnette Caffey. Will keep patient in hospital for observation & repeat BMZ tomorrow.   Assessment and Plan  A: 1. Preterm uterine contractions in third trimester, antepartum   2. Vaginal bleeding in pregnancy, third trimester   3. Rh negative status during pregnancy in third trimester    P: Place in observation bed per Dr. Janalyn Shy 01/10/2017, 6:03 PM

## 2017-01-11 DIAGNOSIS — A6000 Herpesviral infection of urogenital system, unspecified: Secondary | ICD-10-CM | POA: Diagnosis present

## 2017-01-11 DIAGNOSIS — O99343 Other mental disorders complicating pregnancy, third trimester: Secondary | ICD-10-CM | POA: Diagnosis present

## 2017-01-11 DIAGNOSIS — Z3A32 32 weeks gestation of pregnancy: Secondary | ICD-10-CM | POA: Diagnosis not present

## 2017-01-11 DIAGNOSIS — Z6791 Unspecified blood type, Rh negative: Secondary | ICD-10-CM | POA: Diagnosis not present

## 2017-01-11 DIAGNOSIS — O98313 Other infections with a predominantly sexual mode of transmission complicating pregnancy, third trimester: Secondary | ICD-10-CM | POA: Diagnosis present

## 2017-01-11 DIAGNOSIS — F319 Bipolar disorder, unspecified: Secondary | ICD-10-CM | POA: Diagnosis present

## 2017-01-11 DIAGNOSIS — O26893 Other specified pregnancy related conditions, third trimester: Secondary | ICD-10-CM | POA: Diagnosis present

## 2017-01-11 DIAGNOSIS — O4693 Antepartum hemorrhage, unspecified, third trimester: Secondary | ICD-10-CM | POA: Diagnosis present

## 2017-01-11 MED ORDER — MAGNESIUM SULFATE 40 G IN LACTATED RINGERS - SIMPLE
1.0000 g/h | INTRAVENOUS | Status: DC
  Administered 2017-01-11: 1 g/h via INTRAVENOUS
  Filled 2017-01-11: qty 500

## 2017-01-11 NOTE — Progress Notes (Signed)
Patient ID: Katie [name], female   DOB: Apr 29, 1986, 31 y.o.   MRN: [id number] [redacted]w[redacted]d  S// only rare ctx now on MgSO4 2 grams  O//BP 113/69 (BP Location: Right Arm)   Pulse 95   Temp 98.5 F (36.9 C) (Oral)   Resp 18   Ht 5\' 5"  (1.651 m)   Wt 173 lb (78.5 kg)   LMP 05/26/2016   SpO2 98%   BMI 28.79 kg/m   FHR cat I  A+P//[redacted]w[redacted]d  PTL, stable now on Mag, second BMZ @ 5pm today

## 2017-01-12 LAB — CULTURE, BETA STREP (GROUP B ONLY)

## 2017-01-12 MED ORDER — INFLUENZA VAC SPLIT QUAD 0.5 ML IM SUSY
0.5000 mL | PREFILLED_SYRINGE | INTRAMUSCULAR | Status: AC
  Administered 2017-01-13: 0.5 mL via INTRAMUSCULAR

## 2017-01-12 MED ORDER — NIFEDIPINE 10 MG PO CAPS
10.0000 mg | ORAL_CAPSULE | Freq: Four times a day (QID) | ORAL | Status: DC
  Administered 2017-01-12 – 2017-01-13 (×4): 10 mg via ORAL
  Filled 2017-01-12 (×4): qty 1

## 2017-01-12 NOTE — Progress Notes (Signed)
Dc'd magnesium per order. IV saline locked. Pt to be started on Procardia. Patient educated. Toya Smothers, RN

## 2017-01-12 NOTE — Progress Notes (Signed)
Culture, beta strep (group b only) (Order [id number])  Culture, beta strep (group b only)  Order: [id number]  Status:  Final result Visible to patient:  No (Not Released) Next appt:  None  Component 2d ago  Specimen Description VAGINAL/RECTAL   Special Requests NONE   Culture   GROUP B STREP(S.AGALACTIAE)ISOLATED  CRITICAL RESULT CALLED TO, READ BACK BY AND VERIFIED WITH: Philis Kendall, RN [facility]) AT 6712 ON 01/12/17 BY C. JESSUP, MLT.  Performed at [facility], [place] [address], [place], [place] [address]      Report Status 01/12/2017 FINAL   Resulting Agency [facility]    Specimen Collected: 01/10/17 22:05 Last Resulted: 01/12/17 16:07                        Encounter   View Encounter     Result Information   Flag: Abnormal Status: Final result (Collected: 01/10/2017 22:05) Provider Status: Ordered       Lab Information   [facility]    Order-Level Documents:   There are no order-level documents.  View SmartLink Info   Culture, beta strep (group b only) (Order [id number]) on 01/10/17  Result Read / Acknowledged   Acknowledge result  No acknowledgement history exists for this order.       MAU had received call about pt's GBS status of positive. Toya Smothers, RN

## 2017-01-12 NOTE — Progress Notes (Signed)
33 0/7wks  No C/O, no further spotting, no ROM  VSS Afeb  DTR 2+ Uterus soft NT FHT cat one UCs UI  BMTZ x 2 done  A/P: PTL-stable         BMTZ done         VTX         D/C magnesium         Procardia         Probable D/C in am

## 2017-01-12 NOTE — Progress Notes (Signed)
Pt called out to report dark brown discharge with wiping. Pt did not save toilet tissue. Pt instructed to save toilet tissue next time. Toya Smothers, RN

## 2017-01-13 MED ORDER — NIFEDIPINE 10 MG PO CAPS: 10.0000 mg | ORAL_CAPSULE | Freq: Four times a day (QID) | ORAL | 0 refills | Status: DC

## 2017-01-13 NOTE — Discharge Summary (Signed)
Admission Diagnosis: PTL at 32 5/7  Discharge Diagnosis: Same  Hospital Course: 31 year old G 1 P 0 at 63 w 1 day presented several days ago in PTL. She was admitted and administered steroids and tocolytics. Yesterday she was transitioned to oral procardia and did very well.  BP 107/66 (BP Location: Left Arm)   Pulse (!) 58 Comment: nurse notified  Temp 98.1 F (36.7 C) (Oral)   Resp 16   Ht 5\' 5"  (1.651 m)   Wt 78.5 kg (173 lb)   LMP 05/26/2016   SpO2 98%   BMI 28.79 kg/m  No results found for this or any previous visit (from the past 24 hour(s)). Abdomen is soft and non tender Cervix is 80% 1.5 cm 0 station and vertex  Patient was discharged home in good condition She will be given rx procardia to take She will be on bedrest Given strict PTL precautions Follow up in office in 1 week

## 2017-01-13 NOTE — Progress Notes (Signed)
Pt discharged with printed instructions. Pt verbalized an understanding. No concerns noted. Kasch Borquez L Kirk Basquez, RN 

## 2017-01-14 LAB — BPAM RBC
BLOOD PRODUCT EXPIRATION DATE: 201809272359
BLOOD PRODUCT EXPIRATION DATE: 201809282359
UNIT TYPE AND RH: 9500
UNIT TYPE AND RH: 9500

## 2017-01-14 LAB — TYPE AND SCREEN
ABO/RH(D): O NEG
ANTIBODY SCREEN: POSITIVE
DAT, IGG: NEGATIVE
Unit division: 0
Unit division: 0

## 2017-01-15 ENCOUNTER — Inpatient Hospital Stay (HOSPITAL_COMMUNITY)
Admission: AD | Admit: 2017-01-15 | Discharge: 2017-01-15 | Disposition: A | Discharge status: Home / Self Care | Payer: 59 | Source: Ambulatory Visit | Attending: Obstetrics and Gynecology | Admitting: Obstetrics and Gynecology

## 2017-01-15 ENCOUNTER — Encounter (HOSPITAL_COMMUNITY): Payer: Self-pay | Admitting: *Deleted

## 2017-01-15 DIAGNOSIS — O26893 Other specified pregnancy related conditions, third trimester: Secondary | ICD-10-CM | POA: Insufficient documentation

## 2017-01-15 DIAGNOSIS — N898 Other specified noninflammatory disorders of vagina: Secondary | ICD-10-CM | POA: Diagnosis present

## 2017-01-15 DIAGNOSIS — O4703 False labor before 37 completed weeks of gestation, third trimester: Secondary | ICD-10-CM | POA: Diagnosis not present

## 2017-01-15 DIAGNOSIS — F419 Anxiety disorder, unspecified: Secondary | ICD-10-CM | POA: Diagnosis not present

## 2017-01-15 DIAGNOSIS — O99343 Other mental disorders complicating pregnancy, third trimester: Secondary | ICD-10-CM | POA: Diagnosis not present

## 2017-01-15 DIAGNOSIS — Z3A33 33 weeks gestation of pregnancy: Secondary | ICD-10-CM | POA: Diagnosis not present

## 2017-01-15 DIAGNOSIS — F329 Major depressive disorder, single episode, unspecified: Secondary | ICD-10-CM | POA: Diagnosis not present

## 2017-01-15 LAB — URINALYSIS, ROUTINE W REFLEX MICROSCOPIC
BILIRUBIN URINE: NEGATIVE
Glucose, UA: NEGATIVE mg/dL
HGB URINE DIPSTICK: NEGATIVE
Ketones, ur: NEGATIVE mg/dL
Leukocytes, UA: NEGATIVE
Nitrite: NEGATIVE
PH: 7 (ref 5.0–8.0)
Protein, ur: NEGATIVE mg/dL
SPECIFIC GRAVITY, URINE: 1.012 (ref 1.005–1.030)

## 2017-01-15 MED ORDER — LACTATED RINGERS IV BOLUS (SEPSIS)
1000.0000 mL | Freq: Once | INTRAVENOUS | Status: AC
  Administered 2017-01-15: 1000 mL via INTRAVENOUS

## 2017-01-15 NOTE — MAU Provider Note (Signed)
History   G1 @ 33.[redacted] wks gestation in with contractions since 0600. Pt is on procardia for PTL. Denies vag bleeding or ROM. States was d/c from hospital on 01/13/17 where she was treated for PTL and received steriods x 2 doses.  CSN: [id number]  Arrival date & time 01/15/17  0930   First Provider Initiated Contact with Patient 01/15/17 1028      Chief Complaint  Patient presents with  . Vaginal Discharge  . Contractions    HPI  Past Medical History:  Diagnosis Date  . Anxiety   . Bipolar 1 disorder ([place])   . Depression   . Fibroid   . Genital herpes     Past Surgical History:  Procedure Laterality Date  . NO PAST SURGERIES      Family History  Problem Relation Age of Onset  . Arrhythmia Mother   . Hypertension Father   . Arrhythmia Maternal Grandmother   . Diabetes Paternal Grandmother   . Hypertension Paternal Grandmother   . Diabetes Paternal Grandfather   . Hypertension Paternal Grandfather     Social History  Substance Use Topics  . Smoking status: Never Smoker  . Smokeless tobacco: Never Used  . Alcohol use 0.0 oz/week     Comment: SOCIAL     OB History    Gravida Para Term Preterm AB Living   1 0 0 0 0 0   SAB TAB Ectopic Multiple Live Births   0 0 0 0        Review of Systems  Constitutional: Negative.   HENT: Negative.   Eyes: Negative.   Respiratory: Negative.   Cardiovascular: Negative.   Gastrointestinal: Positive for abdominal pain.  Endocrine: Negative.   Genitourinary: Negative.   Musculoskeletal: Negative.   Skin: Negative.   Allergic/Immunologic: Negative.   Neurological: Negative.   Hematological: Negative.   Psychiatric/Behavioral: Negative.     Allergies  Patient has no known allergies.  Home Medications    BP 115/67 (BP Location: Right Arm)   Pulse 93   Temp 98.1 F (36.7 C) (Oral)   Resp 18   Wt 170 lb 8 oz (77.3 kg)   LMP 05/26/2016   SpO2 98%   BMI 28.37 kg/m   Physical Exam  Constitutional: She is  oriented to person, place, and time. She appears well-developed and well-nourished.  HENT:  Head: Normocephalic.  Eyes: Pupils are equal, round, and reactive to light.  Cardiovascular: Normal rate, regular rhythm, normal heart sounds and intact distal pulses.   Pulmonary/Chest: Effort normal and breath sounds normal.  Abdominal: Soft. Bowel sounds are normal.  Genitourinary: Vagina normal and uterus normal.  Musculoskeletal: Normal range of motion.  Neurological: She is alert and oriented to person, place, and time. She has normal reflexes.  Skin: Skin is warm and dry.  Psychiatric: She has a normal mood and affect. Her behavior is normal. Judgment and thought content normal.    MAU Course  Procedures (including critical care time)  Labs Reviewed  URINALYSIS, ROUTINE W REFLEX MICROSCOPIC   No results found.   1. Preterm uterine contractions in third trimester, antepartum       MDM  FHR pattern reassuring. SVE 2/90/0. Mild uc's 4-5 min apart. POC discussed with Dr. Royston Sinner and pt is to receive IV hydration and re eval in one hour. 11:44 SVE no change in cervix since 1st exam. Report to Dr. Royston Sinner. Pt to be d/c home to f/u as needed or if symptoms worsen.

## 2017-01-15 NOTE — MAU Note (Signed)
Was in the hosp for a few days for PTL.  Started losing mucous plug this morning and having a few contractions (not painful).

## 2017-01-15 NOTE — Discharge Instructions (Signed)

## 2017-01-19 ENCOUNTER — Inpatient Hospital Stay (HOSPITAL_COMMUNITY)
Admission: AD | Admit: 2017-01-19 | Discharge: 2017-01-22 | DRG: 775 | Disposition: A | Discharge status: Home / Self Care | Payer: 59 | Source: Ambulatory Visit | Attending: Obstetrics and Gynecology | Admitting: Obstetrics and Gynecology

## 2017-01-19 ENCOUNTER — Encounter (HOSPITAL_COMMUNITY): Payer: Self-pay

## 2017-01-19 DIAGNOSIS — O99824 Streptococcus B carrier state complicating childbirth: Secondary | ICD-10-CM | POA: Diagnosis present

## 2017-01-19 DIAGNOSIS — Z3A34 34 weeks gestation of pregnancy: Secondary | ICD-10-CM | POA: Diagnosis not present

## 2017-01-19 DIAGNOSIS — O42913 Preterm premature rupture of membranes, unspecified as to length of time between rupture and onset of labor, third trimester: Secondary | ICD-10-CM | POA: Diagnosis not present

## 2017-01-19 LAB — URINALYSIS, ROUTINE W REFLEX MICROSCOPIC
BILIRUBIN URINE: NEGATIVE
Glucose, UA: NEGATIVE mg/dL
Hgb urine dipstick: NEGATIVE
Ketones, ur: NEGATIVE mg/dL
Leukocytes, UA: NEGATIVE
NITRITE: NEGATIVE
PH: 7 (ref 5.0–8.0)
Protein, ur: NEGATIVE mg/dL
SPECIFIC GRAVITY, URINE: 1.003 — AB (ref 1.005–1.030)

## 2017-01-19 LAB — CBC
HEMATOCRIT: 38.2 % (ref 36.0–46.0)
HEMOGLOBIN: 13.1 g/dL (ref 12.0–15.0)
MCH: 32 pg (ref 26.0–34.0)
MCHC: 34.3 g/dL (ref 30.0–36.0)
MCV: 93.4 fL (ref 78.0–100.0)
Platelets: 317 10*3/uL (ref 150–400)
RBC: 4.09 MIL/uL (ref 3.87–5.11)
RDW: 13.3 % (ref 11.5–15.5)
WBC: 12.1 10*3/uL — AB (ref 4.0–10.5)

## 2017-01-19 LAB — RPR: RPR: NONREACTIVE

## 2017-01-19 LAB — POCT FERN TEST: POCT FERN TEST: POSITIVE

## 2017-01-19 MED ORDER — OXYCODONE-ACETAMINOPHEN 5-325 MG PO TABS: 2.0000 | ORAL_TABLET | ORAL | Status: DC | PRN

## 2017-01-19 MED ORDER — ONDANSETRON HCL 4 MG/2ML IJ SOLN: 4.0000 mg | Freq: Four times a day (QID) | INTRAMUSCULAR | Status: DC | PRN

## 2017-01-19 MED ORDER — MAGNESIUM SULFATE 40 G IN LACTATED RINGERS - SIMPLE
2.0000 g/h | INTRAVENOUS | Status: DC
  Administered 2017-01-19: 2 g/h via INTRAVENOUS
  Filled 2017-01-19: qty 40

## 2017-01-19 MED ORDER — DEXTROSE 5 % IV SOLN
5.0000 10*6.[IU] | Freq: Once | INTRAVENOUS | Status: AC
  Administered 2017-01-19: 5 10*6.[IU] via INTRAVENOUS
  Filled 2017-01-19: qty 5

## 2017-01-19 MED ORDER — BUTORPHANOL TARTRATE 1 MG/ML IJ SOLN
1.0000 mg | Freq: Once | INTRAMUSCULAR | Status: AC
  Administered 2017-01-20: 1 mg via INTRAVENOUS
  Filled 2017-01-19: qty 1

## 2017-01-19 MED ORDER — LACTATED RINGERS IV SOLN: 500.0000 mL | INTRAVENOUS | Status: DC | PRN

## 2017-01-19 MED ORDER — ARIPIPRAZOLE 10 MG PO TABS
20.0000 mg | ORAL_TABLET | Freq: Every day | ORAL | Status: DC
  Administered 2017-01-19: 20 mg via ORAL
  Filled 2017-01-19 (×2): qty 2

## 2017-01-19 MED ORDER — ACETAMINOPHEN 325 MG PO TABS: 650.0000 mg | ORAL_TABLET | ORAL | Status: DC | PRN

## 2017-01-19 MED ORDER — OXYCODONE-ACETAMINOPHEN 5-325 MG PO TABS: 1.0000 | ORAL_TABLET | ORAL | Status: DC | PRN

## 2017-01-19 MED ORDER — LIDOCAINE HCL (PF) 1 % IJ SOLN
30.0000 mL | INTRAMUSCULAR | Status: AC | PRN
  Administered 2017-01-20: 30 mL via SUBCUTANEOUS
  Filled 2017-01-19: qty 30

## 2017-01-19 MED ORDER — FLEET ENEMA 7-19 GM/118ML RE ENEM: 1.0000 | ENEMA | RECTAL | Status: DC | PRN

## 2017-01-19 MED ORDER — LACTATED RINGERS IV SOLN
INTRAVENOUS | Status: DC
  Administered 2017-01-19 (×2): via INTRAVENOUS

## 2017-01-19 MED ORDER — ACYCLOVIR 200 MG PO CAPS
400.0000 mg | ORAL_CAPSULE | Freq: Every morning | ORAL | Status: DC
  Administered 2017-01-19: 400 mg via ORAL
  Filled 2017-01-19 (×2): qty 2

## 2017-01-19 MED ORDER — TERBUTALINE SULFATE 1 MG/ML IJ SOLN
0.2500 mg | Freq: Once | INTRAMUSCULAR | Status: DC | PRN
  Filled 2017-01-19: qty 1

## 2017-01-19 MED ORDER — SOD CITRATE-CITRIC ACID 500-334 MG/5ML PO SOLN: 30.0000 mL | ORAL | Status: DC | PRN

## 2017-01-19 MED ORDER — PENICILLIN G POT IN DEXTROSE 60000 UNIT/ML IV SOLN
3.0000 10*6.[IU] | INTRAVENOUS | Status: DC
  Administered 2017-01-19 (×5): 3 10*6.[IU] via INTRAVENOUS
  Filled 2017-01-19 (×8): qty 50

## 2017-01-19 MED ORDER — OXYTOCIN 40 UNITS IN LACTATED RINGERS INFUSION - SIMPLE MED
2.5000 [IU]/h | INTRAVENOUS | Status: DC
  Administered 2017-01-20: 2.5 [IU]/h via INTRAVENOUS

## 2017-01-19 MED ORDER — LAMOTRIGINE 200 MG PO TABS
200.0000 mg | ORAL_TABLET | Freq: Every day | ORAL | Status: DC
  Administered 2017-01-19: 200 mg via ORAL
  Filled 2017-01-19 (×2): qty 1

## 2017-01-19 MED ORDER — OXYTOCIN 40 UNITS IN LACTATED RINGERS INFUSION - SIMPLE MED
1.0000 m[IU]/min | INTRAVENOUS | Status: DC
  Administered 2017-01-19: 1 m[IU]/min via INTRAVENOUS
  Filled 2017-01-19: qty 1000

## 2017-01-19 MED ORDER — MAGNESIUM SULFATE BOLUS VIA INFUSION
4.0000 g | Freq: Once | INTRAVENOUS | Status: AC
  Administered 2017-01-19: 4 g via INTRAVENOUS
  Filled 2017-01-19: qty 500

## 2017-01-19 MED ORDER — OXYTOCIN BOLUS FROM INFUSION
500.0000 mL | Freq: Once | INTRAVENOUS | Status: AC
  Administered 2017-01-20: 500 mL via INTRAVENOUS

## 2017-01-19 NOTE — MAU Note (Signed)
Pt presents to MAU with PROM at 11:45 pm last night followed by contractions. Pt states she felt gush of fluid. Pt denies vaginal bleeding. +FM

## 2017-01-19 NOTE — Anesthesia Pain Management Evaluation Note (Signed)
  CRNA Pain Management Visit Note  Patient: Katie [name], 31 y.o., female  "Hello I am a member of the anesthesia team at [facility]. We have an anesthesia team available at all times to provide care throughout the hospital, including epidural management and anesthesia for C-section. I don't know your plan for the delivery whether it a natural birth, water birth, IV sedation, nitrous supplementation, doula or epidural, but we want to meet your pain goals."   1.Was your pain managed to your expectations on prior hospitalizations?   Yes   2.What is your expectation for pain management during this hospitalization?     IV pain meds  3.How can we help you reach that goal? IV meds and nursing suport  Record the patient's initial score and the patient's pain goal.   Pain: 2/10  Pain Goal: unsure [facility] wants you to be able to say your pain was always managed very well.  Ailene Ards 01/19/2017

## 2017-01-19 NOTE — MAU Provider Note (Signed)
S: Ms. Jennafer Gladue is a 31 y.o. G1P0000 at [redacted]w[redacted]d  who presents to MAU today complaining of leaking of fluid since 11:45 pm last night. She denies vaginal bleeding. She endorses contractions. She reports normal fetal movement.    O: BP 118/77 (BP Location: Right Arm)   Pulse (!) 103   Temp 99.1 F (37.3 C) (Oral)   Resp 18   LMP 05/26/2016  GENERAL: Well-developed, well-nourished female in no acute distress.  HEAD: Normocephalic, atraumatic.  CHEST: Normal effort of breathing, regular heart rate ABDOMEN: Soft, nontender, gravid PELVIC: Deferred, ferning slide taken by RN positive and pt leaking large amount of clear fluid visually  Cervical exam:  Dilation: 2 Effacement (%): 90 Cervical Position: Middle Station: 0 Presentation: Vertex Exam by:: Leftwich-Kirby, Izik Bingman CNM   FHR baseline 135 with moderate variability, positive accels, no decels Toco with irregular contractions, mild to palpation  No results found for this or any previous visit (from the past 24 hour(s)).   A: SIUP at 105w0d  SROM  P: Report given to RN to contact MD on call for further instructions  Elvera Maria, CNM 01/19/2017 1:49 AM

## 2017-01-19 NOTE — Progress Notes (Signed)
Patient ID: Katie [name], female   DOB: 1985-09-04, 31 y.o.   MRN: [id number]   [redacted]w[redacted]d  Adm last PM with PPROM after BMZ last week, placed on ABX + MgSO4 overnight  Cx>>1-2/post/vtx  Discussed options, rec D/C Mag, see if spont labor ensues>>consider labor induction after 24-48 hrs ROM if no labor

## 2017-01-20 ENCOUNTER — Encounter (HOSPITAL_COMMUNITY): Payer: Self-pay | Admitting: *Deleted

## 2017-01-20 LAB — CBC
HCT: 32.9 % — ABNORMAL LOW (ref 36.0–46.0)
Hemoglobin: 11.1 g/dL — ABNORMAL LOW (ref 12.0–15.0)
MCH: 31.5 pg (ref 26.0–34.0)
MCHC: 33.7 g/dL (ref 30.0–36.0)
MCV: 93.5 fL (ref 78.0–100.0)
Platelets: 275 10*3/uL (ref 150–400)
RBC: 3.52 MIL/uL — ABNORMAL LOW (ref 3.87–5.11)
RDW: 13.6 % (ref 11.5–15.5)
WBC: 21.3 10*3/uL — ABNORMAL HIGH (ref 4.0–10.5)

## 2017-01-20 MED ORDER — LACTATED RINGERS IV SOLN: 500.0000 mL | Freq: Once | INTRAVENOUS | Status: DC

## 2017-01-20 MED ORDER — BENZOCAINE-MENTHOL 20-0.5 % EX AERO
1.0000 "application " | INHALATION_SPRAY | CUTANEOUS | Status: DC | PRN
  Administered 2017-01-20: 1 via TOPICAL
  Filled 2017-01-20: qty 56

## 2017-01-20 MED ORDER — SIMETHICONE 80 MG PO CHEW: 80.0000 mg | CHEWABLE_TABLET | ORAL | Status: DC | PRN

## 2017-01-20 MED ORDER — DIBUCAINE 1 % RE OINT: 1.0000 "application " | TOPICAL_OINTMENT | RECTAL | Status: DC | PRN

## 2017-01-20 MED ORDER — DIPHENHYDRAMINE HCL 50 MG/ML IJ SOLN: 12.5000 mg | INTRAMUSCULAR | Status: DC | PRN

## 2017-01-20 MED ORDER — TETANUS-DIPHTH-ACELL PERTUSSIS 5-2.5-18.5 LF-MCG/0.5 IM SUSP
0.5000 mL | Freq: Once | INTRAMUSCULAR | Status: DC
Start: 2017-01-21 — End: 2017-01-22

## 2017-01-20 MED ORDER — PHENYLEPHRINE 40 MCG/ML (10ML) SYRINGE FOR IV PUSH (FOR BLOOD PRESSURE SUPPORT)
80.0000 ug | PREFILLED_SYRINGE | INTRAVENOUS | Status: DC | PRN
  Filled 2017-01-20: qty 5

## 2017-01-20 MED ORDER — EPHEDRINE 5 MG/ML INJ
10.0000 mg | INTRAVENOUS | Status: DC | PRN
  Filled 2017-01-20: qty 2

## 2017-01-20 MED ORDER — IBUPROFEN 800 MG PO TABS
800.0000 mg | ORAL_TABLET | Freq: Three times a day (TID) | ORAL | Status: DC | PRN
  Administered 2017-01-20 – 2017-01-22 (×6): 800 mg via ORAL
  Filled 2017-01-20 (×6): qty 1

## 2017-01-20 MED ORDER — BISACODYL 10 MG RE SUPP: 10.0000 mg | Freq: Every day | RECTAL | Status: DC | PRN

## 2017-01-20 MED ORDER — PHENYLEPHRINE 40 MCG/ML (10ML) SYRINGE FOR IV PUSH (FOR BLOOD PRESSURE SUPPORT)
PREFILLED_SYRINGE | INTRAVENOUS | Status: AC
  Filled 2017-01-20: qty 10

## 2017-01-20 MED ORDER — COCONUT OIL OIL: 1.0000 "application " | TOPICAL_OIL | Status: DC | PRN

## 2017-01-20 MED ORDER — ARIPIPRAZOLE 10 MG PO TABS
20.0000 mg | ORAL_TABLET | Freq: Every day | ORAL | Status: DC
  Administered 2017-01-20 – 2017-01-21 (×2): 20 mg via ORAL
  Filled 2017-01-20 (×2): qty 2

## 2017-01-20 MED ORDER — ONDANSETRON HCL 4 MG PO TABS: 4.0000 mg | ORAL_TABLET | ORAL | Status: DC | PRN

## 2017-01-20 MED ORDER — FENTANYL 2.5 MCG/ML BUPIVACAINE 1/10 % EPIDURAL INFUSION (WH - ANES)
INTRAMUSCULAR | Status: AC
  Filled 2017-01-20: qty 100

## 2017-01-20 MED ORDER — SENNOSIDES-DOCUSATE SODIUM 8.6-50 MG PO TABS
2.0000 | ORAL_TABLET | ORAL | Status: DC
  Administered 2017-01-21: 2 via ORAL
  Filled 2017-01-20: qty 2

## 2017-01-20 MED ORDER — OXYCODONE-ACETAMINOPHEN 5-325 MG PO TABS: 2.0000 | ORAL_TABLET | ORAL | Status: DC | PRN

## 2017-01-20 MED ORDER — ACETAMINOPHEN 325 MG PO TABS
650.0000 mg | ORAL_TABLET | ORAL | Status: DC | PRN
  Administered 2017-01-20: 650 mg via ORAL
  Filled 2017-01-20: qty 2

## 2017-01-20 MED ORDER — MEASLES, MUMPS & RUBELLA VAC ~~LOC~~ INJ
0.5000 mL | INJECTION | Freq: Once | SUBCUTANEOUS | Status: DC
  Filled 2017-01-20: qty 0.5

## 2017-01-20 MED ORDER — WITCH HAZEL-GLYCERIN EX PADS
1.0000 "application " | MEDICATED_PAD | CUTANEOUS | Status: DC | PRN
  Administered 2017-01-20: 1 via TOPICAL

## 2017-01-20 MED ORDER — ZOLPIDEM TARTRATE 5 MG PO TABS: 5.0000 mg | ORAL_TABLET | Freq: Every evening | ORAL | Status: DC | PRN

## 2017-01-20 MED ORDER — DIPHENHYDRAMINE HCL 25 MG PO CAPS: 25.0000 mg | ORAL_CAPSULE | Freq: Four times a day (QID) | ORAL | Status: DC | PRN

## 2017-01-20 MED ORDER — PRENATAL MULTIVITAMIN CH
1.0000 | ORAL_TABLET | Freq: Every day | ORAL | Status: DC
  Administered 2017-01-20 – 2017-01-21 (×2): 1 via ORAL
  Filled 2017-01-20 (×2): qty 1

## 2017-01-20 MED ORDER — OXYCODONE-ACETAMINOPHEN 5-325 MG PO TABS: 1.0000 | ORAL_TABLET | ORAL | Status: DC | PRN

## 2017-01-20 MED ORDER — FENTANYL 2.5 MCG/ML BUPIVACAINE 1/10 % EPIDURAL INFUSION (WH - ANES): 14.0000 mL/h | INTRAMUSCULAR | Status: DC | PRN

## 2017-01-20 MED ORDER — LAMOTRIGINE 100 MG PO TABS
200.0000 mg | ORAL_TABLET | Freq: Every day | ORAL | Status: DC
  Administered 2017-01-20 – 2017-01-22 (×3): 200 mg via ORAL
  Filled 2017-01-20 (×3): qty 2

## 2017-01-20 MED ORDER — FLEET ENEMA 7-19 GM/118ML RE ENEM: 1.0000 | ENEMA | Freq: Every day | RECTAL | Status: DC | PRN

## 2017-01-20 MED ORDER — ONDANSETRON HCL 4 MG/2ML IJ SOLN: 4.0000 mg | INTRAMUSCULAR | Status: DC | PRN

## 2017-01-20 NOTE — Progress Notes (Signed)
Called to delivery room as back up as patient was complete/+2 and actively pushing. Assisted with pushing efforts until Dr. Matthew Saras assumed cared. NICU at bedside for preterm. Donnita Falls, MD Family Medicine Resident, PGY-3 01/20/2017 12:28 AM

## 2017-01-20 NOTE — Lactation Note (Signed)
This note was copied from a baby's chart. Lactation Consultation Note  Patient Name: Katie [name] OZDGU'Y Date: 01/20/2017 Reason for consult: Initial assessment;NICU baby  NICU baby 25 hours old. Assisted mom with latching baby to left breast in cross-cradle position. Baby alert and fussy off-and-on at breast. Baby would like and open mouth wide to latch, but could not elicit a suckle with breast or LC's gloved finger. Assisted mom with hand expression and dribbled drops of EBM into baby's mouth--and baby tolerated well. Demonstrated to mom how to support baby at the breast. Discussed with mom how STS, nuzzling/latch attempts benefit baby and mom. Discussed progression of milk coming to volume and importance of pumping every 2-3 hours for a total of 8-12 times/24 hours followed by hand expression. Discussed with parents that this is still a very good first attempt. Enc mom to ask for assistance as needed with pumping. Enc parents to call insurance company about [place]. Mom aware of benefits of hospital-grade pump.   Maternal Data Has patient been taught Hand Expression?: Yes Does the patient have breastfeeding experience prior to this delivery?: No  Feeding Feeding Type: Donor Breast Milk Length of feed: 30 min  LATCH Score Latch: Too sleepy or reluctant, no latch achieved, no sucking elicited.  Audible Swallowing: None  Type of Nipple: Everted at rest and after stimulation  Comfort (Breast/Nipple): Soft / non-tender  Hold (Positioning): Assistance needed to correctly position infant at breast and maintain latch.  LATCH Score: 5  Interventions Interventions: Breast feeding basics reviewed;Assisted with latch;Skin to skin;Hand express;Breast compression;Adjust position;Support pillows;Position options  Lactation Tools Discussed/Used Tools: Pump Breast pump type: Double-Electric Breast Pump Pump Review: Setup, frequency, and cleaning;Milk Storage Initiated by:: bedside  RN Date initiated:: 01/20/17   Consult Status Consult Status: Follow-up Date: 01/21/17 Follow-up type: In-patient    Andres Labrum 01/20/2017, 5:51 PM

## 2017-01-20 NOTE — Progress Notes (Signed)
Post Partum Day 1 Subjective: no complaints, up ad lib, voiding and tolerating PO  Objective: Blood pressure 119/67, pulse 79, temperature 98.4 F (36.9 C), temperature source Oral, resp. rate 16, height 5\' 5"  (1.651 m), weight 77.1 kg (170 lb), last menstrual period 05/26/2016, SpO2 96 %, unknown if currently breastfeeding.  Physical Exam:  General: alert, cooperative and appears stated age Lochia: appropriate Uterine Fundus: firm Incision: N/A DVT Evaluation: No evidence of DVT seen on physical exam. Negative Homan's sign. No cords or calf tenderness. No significant calf/ankle edema.   Recent Labs  01/19/17 0130 01/20/17 0600  HGB 13.1 11.1*  HCT 38.2 32.9*    Assessment/Plan: 31 yo s/p NSVD @ 34.1 wga s/s PPROM.  Doing well. Meeting all pp goals in immediate pp period. Lochia appropriate.  Baby boy in NICU - stable. Encouraged NICU visits frequently as able   LOS: 1 day   Tyson Dense 01/20/2017, 10:49 AM

## 2017-01-21 NOTE — Progress Notes (Signed)
Post Partum Day 1 Subjective: no complaints and up ad lib.  Baby in NICU  Objective: Blood pressure 115/70, pulse 80, temperature 98.4 F (36.9 C), temperature source Oral, resp. rate 16, height 5\' 5"  (1.651 m), weight 170 lb (77.1 kg), last menstrual period 05/26/2016, SpO2 98 %, unknown if currently breastfeeding.  Physical Exam:  General: alert and cooperative Lochia: appropriate Uterine Fundus: firm Incision: n/a DVT Evaluation: No evidence of DVT seen on physical exam.   Recent Labs  01/19/17 0130 01/20/17 0600  HGB 13.1 11.1*  HCT 38.2 32.9*    Assessment/Plan: Plan for discharge tomorrow   LOS: 2 days   Graham Hyun 01/21/2017, 8:34 AM

## 2017-01-21 NOTE — Lactation Note (Signed)
This note was copied from a baby's chart. Lactation Consultation Note  Patient Name: Katie [name] Date: 01/21/2017 Reason for consult: Follow-up assessment;NICU baby;1st time breastfeeding;Infant < 6lbs;Late-preterm 34-36.6wks   Follow up with mom at infant's bedside. Mom had infant nuzzling at the left breast in the cross cradle hold, infant at ackward angle and unable to obtain deep latch. Assisted mom with repositoning infant to the left breast in the football hold. Infant was not showing interest in BF at this time. Worked on hand expression and no colostrum was noted at this time. Mom reports she is pumping every 3 hours on the infants feeding schedule. She is attempting to hand express post BF. She is not obtaining colostrum at this time. Enc mom to continue pumping and reviewed milk coming to volume.   Mom with large firm breasts and short shaft everted nipples. Dad asked several questions about pumping and whether it is important to be worried about switching sides at this time, all questions answered. Enc mom to try BF/nuzzling daily as she and infant are able. Mom voiced understanding.    Maternal Data Formula Feeding for Exclusion: No Has patient been taught Hand Expression?: Yes Does the patient have breastfeeding experience prior to this delivery?: No  Feeding Feeding Type: Breast Fed Length of feed: 30 min  LATCH Score Latch: Too sleepy or reluctant, no latch achieved, no sucking elicited.  Audible Swallowing: None  Type of Nipple: Everted at rest and after stimulation  Comfort (Breast/Nipple): Soft / non-tender  Hold (Positioning): Assistance needed to correctly position infant at breast and maintain latch.  LATCH Score: 5  Interventions Interventions: Breast feeding basics reviewed;Support pillows;Assisted with latch;Position options;Skin to skin;Breast massage;Hand express  Lactation Tools Discussed/Used Pump Review: Setup, frequency, and  cleaning Initiated by:: Reviewed and encouraged every 2-3 hours   Consult Status Consult Status: PRN Follow-up type: Call as needed    Donn Pierini 01/21/2017, 11:35 AM

## 2017-01-21 NOTE — H&P (Signed)
Katie [name] is a 31 y.o. G  1 P 0 at 34 weeks presents with PPROM clear fluid.  Admitted for a few days last week for PTL and administered Magnesium and steroids. Sent home on bedrest. OB History    Gravida Para Term Preterm AB Living   1 1 0 1 0 1   SAB TAB Ectopic Multiple Live Births   0 0 0 0 1     Past Medical History:  Diagnosis Date  . Anxiety   . Bipolar 1 disorder (Katie [name])   . Depression   . Fibroid   . Genital herpes    Past Surgical History:  Procedure Laterality Date  . NO PAST SURGERIES     Family History: family history includes Arrhythmia in her maternal grandmother and mother; Diabetes in her paternal grandfather and paternal grandmother; Hypertension in her father, paternal grandfather, and paternal grandmother. Social History:  reports that she has never smoked. She has never used smokeless tobacco. She reports that she drinks alcohol. Her drug history is not on file.     Maternal Diabetes: No Genetic Screening: Normal Maternal Ultrasounds/Referrals: Normal Fetal Ultrasounds or other Referrals:  None Maternal Substance Abuse:  No Significant Maternal Medications:  None Significant Maternal Lab Results:  None Other Comments:  None  Review of Systems  All other systems reviewed and are negative.  Maternal Medical History:  Reason for admission: Rupture of membranes.     Dilation: 10 Effacement (%): 100 Station: +2, +3 Exam by:: Diggs, MD Blood pressure 115/70, pulse 80, temperature 98.4 F (36.9 C), temperature source Oral, resp. rate 16, height 5\' 5"  (1.651 m), weight 77.1 kg (170 lb), last menstrual period 05/26/2016, SpO2 98 %, unknown if currently breastfeeding. Maternal Exam:  Uterine Assessment: Contraction frequency is irregular.   Abdomen: Fetal presentation: vertex     Fetal Exam Fetal State Assessment: Category I - tracings are normal.     Physical Exam  Constitutional: She appears well-developed.  HENT:  Head:  Normocephalic.  Eyes: Pupils are equal, round, and reactive to light.  Neck: Normal range of motion.  Cardiovascular: Normal rate and regular rhythm.   Respiratory: Effort normal.    Prenatal labs: ABO, Rh: --/--/O NEG (09/19 0130) Antibody: POS (09/19 0130) Rubella: Immune (03/20 0000) RPR: Non Reactive (09/19 0130)  HBsAg: Negative (03/20 0000)  HIV: Non-reactive (03/20 0000)  GBS: Positive (03/20 0000)   Assessment/Plan: IUP at 34 weeks PPROM Admit Magnesium for CP prophylaxis Antibitiotics for GBBS + Possibly discontinue Magnesium and allow for spontaneous labor to occur  Katie [name] 01/21/2017, 7:22 AM

## 2017-01-22 LAB — TYPE AND SCREEN
ABO/RH(D): O NEG
Antibody Screen: POSITIVE
DAT, IGG: NEGATIVE
UNIT DIVISION: 0
UNIT DIVISION: 0

## 2017-01-22 LAB — BPAM RBC
Blood Product Expiration Date: 201810172359
Blood Product Expiration Date: 201810172359
Unit Type and Rh: 9500
Unit Type and Rh: 9500

## 2017-01-22 MED ORDER — IBUPROFEN 800 MG PO TABS: 800.0000 mg | ORAL_TABLET | Freq: Three times a day (TID) | ORAL | 0 refills | Status: DC | PRN

## 2017-01-22 NOTE — Lactation Note (Signed)
This note was copied from a baby's chart. Lactation Consultation Note  Patient Name: Katie [name] Date: 01/22/2017   Baby 31 hours old in NICU.  Visited w/ mother who is concerned because she has not view drops of colostrum. Reviewed hand expression with mother bilaterally and small drops expressed. Encouraged mother to continue pumping q2-3 hours with hand expression before and after pumping. Recommend watching hands on pumping video. Reviewed milk storage and transportation. Mother will call insurance company to discuss pump and possibly visit store for pump rental.      Maternal Data    Feeding    LATCH Score                   Interventions    Lactation Tools Discussed/Used     Consult Status      Vivianne Master [facility] 01/22/2017, 9:24 AM

## 2017-01-22 NOTE — Discharge Summary (Signed)
Obstetric Discharge Summary Reason for Admission: rupture of membranes Prenatal Procedures: ultrasound Intrapartum Procedures: spontaneous vaginal delivery Postpartum Procedures: none Complications-Operative and Postpartum: periurethral & 2nd degree laceration Hemoglobin  Date Value Ref Range Status  01/20/2017 11.1 (L) 12.0 - 15.0 g/dL Final   HCT  Date Value Ref Range Status  01/20/2017 32.9 (L) 36.0 - 46.0 % Final    Physical Exam:  General: alert and appears stated age 31: appropriate Uterine Fundus: firm Incision: n/a DVT Evaluation: No evidence of DVT seen on physical exam.  Discharge Diagnoses: PPROM  Discharge Information: Date: 01/22/2017 Activity: pelvic rest Diet: routine Medications: PNV and Ibuprofen Condition: stable Instructions: refer to practice specific booklet Discharge to: home [place] Schedule an appointment as soon as possible for a visit in 6 week(s).   Contact information: [name] [address] [phone]           Newborn Data: Live born female  Birth Weight: 4 lb 8.3 oz (2050 g) APGAR: 8, 9  Home with NICU.  Katie [name] 01/22/2017, 9:38 AM

## 2017-01-24 ENCOUNTER — Ambulatory Visit: Payer: Self-pay

## 2017-01-24 NOTE — Lactation Note (Signed)
This note was copied from a baby's chart. Lactation Consultation Note  Patient Name: Katie [name] [id number] Date: 01/24/2017 Reason for consult: Follow-up assessment;NICU baby;Infant < 6lbs   Follow up at infant's bedside. Mom reports she is pumping and hand expressing at least every 3 hours. She has noted a glistening with hand expression. Enc mom to continue pumping and to be patient, that some women aren't able to collect milk until at least day 5. Mom voiced understanding. Mom was attempting to BF infant currently. Infant was suckling latching an suckling a few times at the breast, enc mom to continue to latch as able.    Maternal Data Formula Feeding for Exclusion: No Has patient been taught Hand Expression?: Yes Does the patient have breastfeeding experience prior to this delivery?: No  Feeding Feeding Type: Donor Breast Milk Length of feed: 30 min  LATCH Score                   Interventions    Lactation Tools Discussed/Used Initiated by:: reviewed and encouraged every 2-3 hours   Consult Status Consult Status: PRN Date: 01/25/17 Follow-up type: Call as needed    Debby Freiberg Ailana Cuadrado 01/24/2017, 11:44 AM

## 2017-01-29 ENCOUNTER — Ambulatory Visit: Payer: Self-pay

## 2017-01-29 NOTE — Lactation Note (Addendum)
This note was copied from a baby's chart. Lactation Consultation Note  Patient Name: Katie [name] [id number] Date: 01/29/2017 Reason for consult: Follow-up assessment;Difficult latch  Baby 49 days old. Assisted mom to latch baby to right breast in both cross-cradle and football position, but baby sleepy at breast. Fitted mom with a #20 NS, but could not elicit a suckle from baby. A #16 would probably be a better fit for mom's nipple, but baby would not suckle. Demonstrated waking techniques and about to reposition baby when he pulled out his NG tube. Butch Penny, RN notified and will replace. Parents and Butch Penny, RN report that baby that baby consistently attempts to pull out tubing. Mom reports that she has been pumping routinely every 3 hours around the clock and the most she has been able to collect is one drop. Enc mom to make sure that the baby gets that drop when collect. Assisted mom with hand expression with no EBM flowing. Mom states that she does not have any issues with her hormones--she became pregnant easily, and has to thyroid issues. Mom did question bipolar medications. Enc mom to continue all medications and discussed the benefits for mom. Discussed progression of milk coming to volume, and power pumping just before sleep. Enc mom to sleepy 5-6 hours, and then pump 3 times--once right when she wakes up, and then 2 more times 2 hours apart. Discussed galactagogues with mom, and enc mom to discuss with OB. Discussed the importance of first 2 weeks to lactation with this baby. Discussed with mom that she has done everything possible to produce milk, but to follow up with OB.   Maternal Data    Feeding Feeding Type: Breast Fed Nipple Type: Slow - flow Length of feed: 0 min  LATCH Score Latch: Too sleepy or reluctant, no latch achieved, no sucking elicited.  Audible Swallowing: None  Type of Nipple: Everted at rest and after stimulation  Comfort (Breast/Nipple): Soft /  non-tender  Hold (Positioning): Assistance needed to correctly position infant at breast and maintain latch.  LATCH Score: 5  Interventions Interventions: Breast feeding basics reviewed;Assisted with latch;Skin to skin;Hand express;Breast compression;Adjust position;Support pillows;Position options  Lactation Tools Discussed/Used Tools: Nipple Shields Nipple shield size: 20   Consult Status Consult Status: PRN    Andres Labrum 01/29/2017, 11:43 AM

## 2017-02-01 ENCOUNTER — Ambulatory Visit: Payer: Self-pay

## 2017-02-01 NOTE — Lactation Note (Addendum)
This note was copied from a baby's chart. Lactation Consultation Note  Patient Name: Boy Jolyn Deshmukh [id number] Date: 02/01/2017   Baby 23 days old in NICU and mother has been unable to hand express or pump more than a few drops.  She states she has been pumping regularly.  Originally she was advised to pump q 3 hours with the exception of once during the night and was receiving drops.  She was then advised to power pump before bedtime and pump q 2 hours. - this strategy has resulted in no drops.  Recommend to mother to follow the strategy that gives the best result.    Observed mother hand express and adjusted hand position.  Also refitted mother with #21 flanges.  Mother was unable to pump any volume.  Per NICU RN, RN offered to help baby latch at the breast, mother declined offer.  Provided information regarding Abilify which mother is taking which has the potential to reduce her milk supply.  Provided "Clent Jacks" information to discuss with her MD.   Discussed galatagogues and requested OP appointment for Friday to possibly initiate SNS.  Encouraged pumping after STS with baby and watching photo/video of baby while pumping.  Also recommend mother continue hands on pumping.  Mother states she has ordered hands free bra.  Reassured mother and praised her for her efforts.      Maternal Data    Feeding Feeding Type: Formula Nipple Type: Slow - flow Length of feed: 20 min (fed by mother)  [facility] Score                   Interventions    Lactation Tools Discussed/Used     Consult Status      Carlye Grippe 02/01/2017, 1:57 PM

## 2017-03-02 DIAGNOSIS — Z1389 Encounter for screening for other disorder: Secondary | ICD-10-CM | POA: Diagnosis not present

## 2017-03-22 DIAGNOSIS — Z79899 Other long term (current) drug therapy: Secondary | ICD-10-CM | POA: Diagnosis not present

## 2017-05-13 DIAGNOSIS — R609 Edema, unspecified: Secondary | ICD-10-CM | POA: Diagnosis not present

## 2017-06-13 DIAGNOSIS — R600 Localized edema: Secondary | ICD-10-CM | POA: Diagnosis not present

## 2017-07-11 DIAGNOSIS — M25512 Pain in left shoulder: Secondary | ICD-10-CM | POA: Diagnosis not present

## 2017-07-11 DIAGNOSIS — R600 Localized edema: Secondary | ICD-10-CM | POA: Diagnosis not present

## 2017-07-24 DIAGNOSIS — S62101A Fracture of unspecified carpal bone, right wrist, initial encounter for closed fracture: Secondary | ICD-10-CM | POA: Diagnosis not present

## 2017-07-25 DIAGNOSIS — S6291XA Unspecified fracture of right wrist and hand, initial encounter for closed fracture: Secondary | ICD-10-CM | POA: Diagnosis not present

## 2017-07-28 DIAGNOSIS — S63501A Unspecified sprain of right wrist, initial encounter: Secondary | ICD-10-CM | POA: Diagnosis not present

## 2017-10-12 DIAGNOSIS — Z0000 Encounter for general adult medical examination without abnormal findings: Secondary | ICD-10-CM | POA: Diagnosis not present

## 2017-10-14 DIAGNOSIS — Z0000 Encounter for general adult medical examination without abnormal findings: Secondary | ICD-10-CM | POA: Diagnosis not present

## 2017-11-17 DIAGNOSIS — N941 Unspecified dyspareunia: Secondary | ICD-10-CM | POA: Diagnosis not present

## 2017-11-17 DIAGNOSIS — N760 Acute vaginitis: Secondary | ICD-10-CM | POA: Diagnosis not present

## 2017-11-21 DIAGNOSIS — N62 Hypertrophy of breast: Secondary | ICD-10-CM | POA: Diagnosis not present

## 2018-01-20 DIAGNOSIS — R600 Localized edema: Secondary | ICD-10-CM | POA: Diagnosis not present

## 2018-01-20 DIAGNOSIS — Z6828 Body mass index (BMI) 28.0-28.9, adult: Secondary | ICD-10-CM | POA: Diagnosis not present

## 2018-04-16 DIAGNOSIS — Z23 Encounter for immunization: Secondary | ICD-10-CM | POA: Diagnosis not present

## 2018-06-29 DIAGNOSIS — Z79899 Other long term (current) drug therapy: Secondary | ICD-10-CM | POA: Diagnosis not present

## 2018-07-06 DIAGNOSIS — Z6826 Body mass index (BMI) 26.0-26.9, adult: Secondary | ICD-10-CM | POA: Diagnosis not present

## 2018-07-06 DIAGNOSIS — L209 Atopic dermatitis, unspecified: Secondary | ICD-10-CM | POA: Diagnosis not present

## 2018-07-07 DIAGNOSIS — G43909 Migraine, unspecified, not intractable, without status migrainosus: Secondary | ICD-10-CM | POA: Insufficient documentation

## 2018-07-07 DIAGNOSIS — B009 Herpesviral infection, unspecified: Secondary | ICD-10-CM | POA: Insufficient documentation

## 2018-07-07 DIAGNOSIS — F319 Bipolar disorder, unspecified: Secondary | ICD-10-CM | POA: Insufficient documentation

## 2018-07-10 DIAGNOSIS — Z6826 Body mass index (BMI) 26.0-26.9, adult: Secondary | ICD-10-CM | POA: Diagnosis not present

## 2018-07-10 DIAGNOSIS — Z01419 Encounter for gynecological examination (general) (routine) without abnormal findings: Secondary | ICD-10-CM | POA: Diagnosis not present

## 2018-09-19 DIAGNOSIS — F3181 Bipolar II disorder: Secondary | ICD-10-CM | POA: Diagnosis not present

## 2018-10-02 DIAGNOSIS — F3181 Bipolar II disorder: Secondary | ICD-10-CM | POA: Diagnosis not present

## 2018-10-03 DIAGNOSIS — F3181 Bipolar II disorder: Secondary | ICD-10-CM | POA: Diagnosis not present

## 2018-10-03 DIAGNOSIS — F319 Bipolar disorder, unspecified: Secondary | ICD-10-CM | POA: Diagnosis not present

## 2018-10-03 DIAGNOSIS — R5383 Other fatigue: Secondary | ICD-10-CM | POA: Diagnosis not present

## 2018-10-03 DIAGNOSIS — Z719 Counseling, unspecified: Secondary | ICD-10-CM | POA: Diagnosis not present

## 2018-10-03 DIAGNOSIS — E559 Vitamin D deficiency, unspecified: Secondary | ICD-10-CM | POA: Diagnosis not present

## 2018-10-20 DIAGNOSIS — R5383 Other fatigue: Secondary | ICD-10-CM | POA: Diagnosis not present

## 2018-10-20 DIAGNOSIS — Z79899 Other long term (current) drug therapy: Secondary | ICD-10-CM | POA: Diagnosis not present

## 2018-10-30 DIAGNOSIS — F3181 Bipolar II disorder: Secondary | ICD-10-CM | POA: Diagnosis not present

## 2018-10-31 DIAGNOSIS — E559 Vitamin D deficiency, unspecified: Secondary | ICD-10-CM | POA: Diagnosis not present

## 2018-10-31 DIAGNOSIS — F3131 Bipolar disorder, current episode depressed, mild: Secondary | ICD-10-CM | POA: Diagnosis not present

## 2018-10-31 DIAGNOSIS — F319 Bipolar disorder, unspecified: Secondary | ICD-10-CM | POA: Diagnosis not present

## 2018-10-31 DIAGNOSIS — R5383 Other fatigue: Secondary | ICD-10-CM | POA: Diagnosis not present

## 2018-10-31 DIAGNOSIS — F411 Generalized anxiety disorder: Secondary | ICD-10-CM | POA: Diagnosis not present

## 2018-11-14 DIAGNOSIS — F3131 Bipolar disorder, current episode depressed, mild: Secondary | ICD-10-CM | POA: Diagnosis not present

## 2018-11-27 DIAGNOSIS — F3181 Bipolar II disorder: Secondary | ICD-10-CM | POA: Diagnosis not present

## 2018-12-05 DIAGNOSIS — F3131 Bipolar disorder, current episode depressed, mild: Secondary | ICD-10-CM | POA: Diagnosis not present

## 2018-12-20 DIAGNOSIS — F3131 Bipolar disorder, current episode depressed, mild: Secondary | ICD-10-CM | POA: Diagnosis not present

## 2018-12-28 DIAGNOSIS — F3181 Bipolar II disorder: Secondary | ICD-10-CM | POA: Diagnosis not present

## 2019-01-02 DIAGNOSIS — F3131 Bipolar disorder, current episode depressed, mild: Secondary | ICD-10-CM | POA: Diagnosis not present

## 2019-01-17 DIAGNOSIS — F3131 Bipolar disorder, current episode depressed, mild: Secondary | ICD-10-CM | POA: Diagnosis not present

## 2019-01-23 DIAGNOSIS — G4700 Insomnia, unspecified: Secondary | ICD-10-CM | POA: Diagnosis not present

## 2019-01-23 DIAGNOSIS — F411 Generalized anxiety disorder: Secondary | ICD-10-CM | POA: Diagnosis not present

## 2019-01-23 DIAGNOSIS — F319 Bipolar disorder, unspecified: Secondary | ICD-10-CM | POA: Diagnosis not present

## 2019-01-25 DIAGNOSIS — F3181 Bipolar II disorder: Secondary | ICD-10-CM | POA: Diagnosis not present

## 2019-02-07 DIAGNOSIS — F3131 Bipolar disorder, current episode depressed, mild: Secondary | ICD-10-CM | POA: Diagnosis not present

## 2019-02-16 DIAGNOSIS — F319 Bipolar disorder, unspecified: Secondary | ICD-10-CM | POA: Diagnosis not present

## 2019-02-16 DIAGNOSIS — G4700 Insomnia, unspecified: Secondary | ICD-10-CM | POA: Diagnosis not present

## 2019-02-16 DIAGNOSIS — F411 Generalized anxiety disorder: Secondary | ICD-10-CM | POA: Diagnosis not present

## 2019-03-06 DIAGNOSIS — F3181 Bipolar II disorder: Secondary | ICD-10-CM | POA: Diagnosis not present

## 2019-03-07 DIAGNOSIS — F3131 Bipolar disorder, current episode depressed, mild: Secondary | ICD-10-CM | POA: Diagnosis not present

## 2019-03-13 DIAGNOSIS — F3131 Bipolar disorder, current episode depressed, mild: Secondary | ICD-10-CM | POA: Diagnosis not present

## 2019-03-21 DIAGNOSIS — Z1159 Encounter for screening for other viral diseases: Secondary | ICD-10-CM | POA: Diagnosis not present

## 2019-03-21 DIAGNOSIS — F3181 Bipolar II disorder: Secondary | ICD-10-CM | POA: Diagnosis not present

## 2019-03-21 DIAGNOSIS — Z0000 Encounter for general adult medical examination without abnormal findings: Secondary | ICD-10-CM | POA: Diagnosis not present

## 2019-03-21 DIAGNOSIS — E559 Vitamin D deficiency, unspecified: Secondary | ICD-10-CM | POA: Diagnosis not present

## 2019-03-21 DIAGNOSIS — Z79899 Other long term (current) drug therapy: Secondary | ICD-10-CM | POA: Diagnosis not present

## 2019-03-27 DIAGNOSIS — F3131 Bipolar disorder, current episode depressed, mild: Secondary | ICD-10-CM | POA: Diagnosis not present

## 2019-03-28 DIAGNOSIS — L309 Dermatitis, unspecified: Secondary | ICD-10-CM | POA: Diagnosis not present

## 2019-03-28 DIAGNOSIS — Z23 Encounter for immunization: Secondary | ICD-10-CM | POA: Diagnosis not present

## 2019-03-28 DIAGNOSIS — Z6828 Body mass index (BMI) 28.0-28.9, adult: Secondary | ICD-10-CM | POA: Diagnosis not present

## 2019-03-28 DIAGNOSIS — Z0000 Encounter for general adult medical examination without abnormal findings: Secondary | ICD-10-CM | POA: Diagnosis not present

## 2019-04-04 DIAGNOSIS — F3131 Bipolar disorder, current episode depressed, mild: Secondary | ICD-10-CM | POA: Diagnosis not present

## 2019-04-05 DIAGNOSIS — F3181 Bipolar II disorder: Secondary | ICD-10-CM | POA: Diagnosis not present

## 2019-04-17 DIAGNOSIS — F3131 Bipolar disorder, current episode depressed, mild: Secondary | ICD-10-CM | POA: Diagnosis not present

## 2019-05-01 DIAGNOSIS — F3181 Bipolar II disorder: Secondary | ICD-10-CM | POA: Diagnosis not present

## 2019-05-01 DIAGNOSIS — F3131 Bipolar disorder, current episode depressed, mild: Secondary | ICD-10-CM | POA: Diagnosis not present

## 2019-05-18 DIAGNOSIS — F3131 Bipolar disorder, current episode depressed, mild: Secondary | ICD-10-CM | POA: Diagnosis not present

## 2019-05-21 DIAGNOSIS — F3181 Bipolar II disorder: Secondary | ICD-10-CM | POA: Diagnosis not present

## 2019-05-29 DIAGNOSIS — F3131 Bipolar disorder, current episode depressed, mild: Secondary | ICD-10-CM | POA: Diagnosis not present

## 2019-06-12 DIAGNOSIS — F3131 Bipolar disorder, current episode depressed, mild: Secondary | ICD-10-CM | POA: Diagnosis not present

## 2019-06-14 DIAGNOSIS — F3181 Bipolar II disorder: Secondary | ICD-10-CM | POA: Diagnosis not present

## 2019-06-26 DIAGNOSIS — F3176 Bipolar disorder, in full remission, most recent episode depressed: Secondary | ICD-10-CM | POA: Diagnosis not present

## 2019-07-10 DIAGNOSIS — F3176 Bipolar disorder, in full remission, most recent episode depressed: Secondary | ICD-10-CM | POA: Diagnosis not present

## 2019-07-18 DIAGNOSIS — F4312 Post-traumatic stress disorder, chronic: Secondary | ICD-10-CM | POA: Diagnosis not present

## 2019-07-18 DIAGNOSIS — F3181 Bipolar II disorder: Secondary | ICD-10-CM | POA: Diagnosis not present

## 2019-07-24 DIAGNOSIS — F3176 Bipolar disorder, in full remission, most recent episode depressed: Secondary | ICD-10-CM | POA: Diagnosis not present

## 2019-07-25 DIAGNOSIS — N941 Unspecified dyspareunia: Secondary | ICD-10-CM | POA: Diagnosis not present

## 2019-07-25 DIAGNOSIS — N898 Other specified noninflammatory disorders of vagina: Secondary | ICD-10-CM | POA: Diagnosis not present

## 2019-07-25 DIAGNOSIS — B009 Herpesviral infection, unspecified: Secondary | ICD-10-CM | POA: Diagnosis not present

## 2019-08-07 DIAGNOSIS — F3176 Bipolar disorder, in full remission, most recent episode depressed: Secondary | ICD-10-CM | POA: Diagnosis not present

## 2019-08-16 DIAGNOSIS — Z6830 Body mass index (BMI) 30.0-30.9, adult: Secondary | ICD-10-CM | POA: Diagnosis not present

## 2019-08-16 DIAGNOSIS — Z309 Encounter for contraceptive management, unspecified: Secondary | ICD-10-CM | POA: Diagnosis not present

## 2019-08-16 DIAGNOSIS — Z01419 Encounter for gynecological examination (general) (routine) without abnormal findings: Secondary | ICD-10-CM | POA: Diagnosis not present

## 2019-08-16 DIAGNOSIS — B009 Herpesviral infection, unspecified: Secondary | ICD-10-CM | POA: Diagnosis not present

## 2019-08-23 DIAGNOSIS — F3181 Bipolar II disorder: Secondary | ICD-10-CM | POA: Diagnosis not present

## 2019-08-23 DIAGNOSIS — F4312 Post-traumatic stress disorder, chronic: Secondary | ICD-10-CM | POA: Diagnosis not present

## 2019-08-28 DIAGNOSIS — F3176 Bipolar disorder, in full remission, most recent episode depressed: Secondary | ICD-10-CM | POA: Diagnosis not present

## 2019-09-07 DIAGNOSIS — F4310 Post-traumatic stress disorder, unspecified: Secondary | ICD-10-CM | POA: Diagnosis not present

## 2019-09-07 DIAGNOSIS — F3340 Major depressive disorder, recurrent, in remission, unspecified: Secondary | ICD-10-CM | POA: Diagnosis not present

## 2019-09-07 DIAGNOSIS — F3181 Bipolar II disorder: Secondary | ICD-10-CM | POA: Diagnosis not present

## 2019-09-11 DIAGNOSIS — F3176 Bipolar disorder, in full remission, most recent episode depressed: Secondary | ICD-10-CM | POA: Diagnosis not present

## 2019-09-19 DIAGNOSIS — Z23 Encounter for immunization: Secondary | ICD-10-CM | POA: Diagnosis not present

## 2019-09-25 DIAGNOSIS — M25511 Pain in right shoulder: Secondary | ICD-10-CM | POA: Diagnosis not present

## 2019-09-26 DIAGNOSIS — F4312 Post-traumatic stress disorder, chronic: Secondary | ICD-10-CM | POA: Diagnosis not present

## 2019-09-26 DIAGNOSIS — F3181 Bipolar II disorder: Secondary | ICD-10-CM | POA: Diagnosis not present

## 2019-10-02 DIAGNOSIS — F3176 Bipolar disorder, in full remission, most recent episode depressed: Secondary | ICD-10-CM | POA: Diagnosis not present

## 2019-10-16 DIAGNOSIS — F3176 Bipolar disorder, in full remission, most recent episode depressed: Secondary | ICD-10-CM | POA: Diagnosis not present

## 2019-10-31 DIAGNOSIS — F3176 Bipolar disorder, in full remission, most recent episode depressed: Secondary | ICD-10-CM | POA: Diagnosis not present

## 2019-11-06 DIAGNOSIS — F4312 Post-traumatic stress disorder, chronic: Secondary | ICD-10-CM | POA: Diagnosis not present

## 2019-11-06 DIAGNOSIS — F3181 Bipolar II disorder: Secondary | ICD-10-CM | POA: Diagnosis not present

## 2019-11-08 DIAGNOSIS — M7541 Impingement syndrome of right shoulder: Secondary | ICD-10-CM | POA: Diagnosis not present

## 2019-11-13 DIAGNOSIS — F3176 Bipolar disorder, in full remission, most recent episode depressed: Secondary | ICD-10-CM | POA: Diagnosis not present

## 2019-11-27 DIAGNOSIS — F3176 Bipolar disorder, in full remission, most recent episode depressed: Secondary | ICD-10-CM | POA: Diagnosis not present

## 2019-12-11 DIAGNOSIS — F3181 Bipolar II disorder: Secondary | ICD-10-CM | POA: Diagnosis not present

## 2019-12-11 DIAGNOSIS — F4312 Post-traumatic stress disorder, chronic: Secondary | ICD-10-CM | POA: Diagnosis not present

## 2019-12-18 DIAGNOSIS — F3176 Bipolar disorder, in full remission, most recent episode depressed: Secondary | ICD-10-CM | POA: Diagnosis not present

## 2020-01-01 DIAGNOSIS — F3176 Bipolar disorder, in full remission, most recent episode depressed: Secondary | ICD-10-CM | POA: Diagnosis not present

## 2020-01-10 DIAGNOSIS — F4312 Post-traumatic stress disorder, chronic: Secondary | ICD-10-CM | POA: Diagnosis not present

## 2020-01-10 DIAGNOSIS — F3181 Bipolar II disorder: Secondary | ICD-10-CM | POA: Diagnosis not present

## 2020-01-15 DIAGNOSIS — F3176 Bipolar disorder, in full remission, most recent episode depressed: Secondary | ICD-10-CM | POA: Diagnosis not present

## 2020-01-16 DIAGNOSIS — M7541 Impingement syndrome of right shoulder: Secondary | ICD-10-CM | POA: Diagnosis not present

## 2020-01-16 DIAGNOSIS — G4700 Insomnia, unspecified: Secondary | ICD-10-CM | POA: Diagnosis not present

## 2020-01-16 DIAGNOSIS — R5383 Other fatigue: Secondary | ICD-10-CM | POA: Diagnosis not present

## 2020-01-16 DIAGNOSIS — Z23 Encounter for immunization: Secondary | ICD-10-CM | POA: Diagnosis not present

## 2020-01-25 DIAGNOSIS — F4312 Post-traumatic stress disorder, chronic: Secondary | ICD-10-CM | POA: Diagnosis not present

## 2020-01-25 DIAGNOSIS — F3181 Bipolar II disorder: Secondary | ICD-10-CM | POA: Diagnosis not present

## 2020-01-29 DIAGNOSIS — F3176 Bipolar disorder, in full remission, most recent episode depressed: Secondary | ICD-10-CM | POA: Diagnosis not present

## 2020-02-05 DIAGNOSIS — F3176 Bipolar disorder, in full remission, most recent episode depressed: Secondary | ICD-10-CM | POA: Diagnosis not present

## 2020-02-14 DIAGNOSIS — R58 Hemorrhage, not elsewhere classified: Secondary | ICD-10-CM | POA: Diagnosis not present

## 2020-02-14 DIAGNOSIS — S7012XA Contusion of left thigh, initial encounter: Secondary | ICD-10-CM | POA: Diagnosis not present

## 2020-02-15 DIAGNOSIS — F3181 Bipolar II disorder: Secondary | ICD-10-CM | POA: Diagnosis not present

## 2020-02-15 DIAGNOSIS — F4312 Post-traumatic stress disorder, chronic: Secondary | ICD-10-CM | POA: Diagnosis not present

## 2020-02-19 DIAGNOSIS — F3176 Bipolar disorder, in full remission, most recent episode depressed: Secondary | ICD-10-CM | POA: Diagnosis not present

## 2020-02-22 DIAGNOSIS — F4312 Post-traumatic stress disorder, chronic: Secondary | ICD-10-CM | POA: Diagnosis not present

## 2020-02-22 DIAGNOSIS — E7212 Methylenetetrahydrofolate reductase deficiency: Secondary | ICD-10-CM | POA: Diagnosis not present

## 2020-02-22 DIAGNOSIS — F3181 Bipolar II disorder: Secondary | ICD-10-CM | POA: Diagnosis not present

## 2020-02-29 DIAGNOSIS — F3181 Bipolar II disorder: Secondary | ICD-10-CM | POA: Diagnosis not present

## 2020-02-29 DIAGNOSIS — F428 Other obsessive-compulsive disorder: Secondary | ICD-10-CM | POA: Diagnosis not present

## 2020-02-29 DIAGNOSIS — F4312 Post-traumatic stress disorder, chronic: Secondary | ICD-10-CM | POA: Diagnosis not present

## 2020-03-04 DIAGNOSIS — F3176 Bipolar disorder, in full remission, most recent episode depressed: Secondary | ICD-10-CM | POA: Diagnosis not present

## 2020-03-24 DIAGNOSIS — F900 Attention-deficit hyperactivity disorder, predominantly inattentive type: Secondary | ICD-10-CM | POA: Diagnosis not present

## 2020-03-25 DIAGNOSIS — G4700 Insomnia, unspecified: Secondary | ICD-10-CM | POA: Diagnosis not present

## 2020-03-25 DIAGNOSIS — R0683 Snoring: Secondary | ICD-10-CM | POA: Diagnosis not present

## 2020-03-25 DIAGNOSIS — F3176 Bipolar disorder, in full remission, most recent episode depressed: Secondary | ICD-10-CM | POA: Diagnosis not present

## 2020-03-25 DIAGNOSIS — R400 Somnolence: Secondary | ICD-10-CM | POA: Diagnosis not present

## 2020-03-25 DIAGNOSIS — Z0000 Encounter for general adult medical examination without abnormal findings: Secondary | ICD-10-CM | POA: Diagnosis not present

## 2020-04-03 DIAGNOSIS — F3176 Bipolar disorder, in full remission, most recent episode depressed: Secondary | ICD-10-CM | POA: Diagnosis not present

## 2020-04-03 DIAGNOSIS — Z23 Encounter for immunization: Secondary | ICD-10-CM | POA: Diagnosis not present

## 2020-04-03 DIAGNOSIS — Z0000 Encounter for general adult medical examination without abnormal findings: Secondary | ICD-10-CM | POA: Diagnosis not present

## 2020-04-08 DIAGNOSIS — F3176 Bipolar disorder, in full remission, most recent episode depressed: Secondary | ICD-10-CM | POA: Diagnosis not present

## 2020-04-22 DIAGNOSIS — F428 Other obsessive-compulsive disorder: Secondary | ICD-10-CM | POA: Diagnosis not present

## 2020-04-22 DIAGNOSIS — F3176 Bipolar disorder, in full remission, most recent episode depressed: Secondary | ICD-10-CM | POA: Diagnosis not present

## 2020-04-22 DIAGNOSIS — F3181 Bipolar II disorder: Secondary | ICD-10-CM | POA: Diagnosis not present

## 2020-04-22 DIAGNOSIS — F4312 Post-traumatic stress disorder, chronic: Secondary | ICD-10-CM | POA: Diagnosis not present

## 2020-04-22 DIAGNOSIS — F900 Attention-deficit hyperactivity disorder, predominantly inattentive type: Secondary | ICD-10-CM | POA: Diagnosis not present

## 2020-05-06 DIAGNOSIS — F3176 Bipolar disorder, in full remission, most recent episode depressed: Secondary | ICD-10-CM | POA: Diagnosis not present

## 2020-05-07 ENCOUNTER — Ambulatory Visit: Payer: BC Managed Care – PPO | Admitting: Neurology

## 2020-05-07 ENCOUNTER — Encounter: Payer: Self-pay | Admitting: Neurology

## 2020-05-07 VITALS — BP 144/83 | HR 71 | Ht 64.0 in | Wt 161.0 lb

## 2020-05-07 DIAGNOSIS — R0683 Snoring: Secondary | ICD-10-CM

## 2020-05-07 DIAGNOSIS — G4700 Insomnia, unspecified: Secondary | ICD-10-CM | POA: Diagnosis not present

## 2020-05-07 DIAGNOSIS — Z820 Family history of epilepsy and other diseases of the nervous system: Secondary | ICD-10-CM

## 2020-05-07 DIAGNOSIS — G4719 Other hypersomnia: Secondary | ICD-10-CM | POA: Diagnosis not present

## 2020-05-07 DIAGNOSIS — R351 Nocturia: Secondary | ICD-10-CM

## 2020-05-07 DIAGNOSIS — R002 Palpitations: Secondary | ICD-10-CM

## 2020-05-07 DIAGNOSIS — F39 Unspecified mood [affective] disorder: Secondary | ICD-10-CM

## 2020-05-07 NOTE — Patient Instructions (Signed)
Based on your symptoms and your exam I believe we should look for an underlying organic sleep disorder, such as obstructive sleep apnea (OSA) with a sleep study.   If your Sleep study shows obstructive sleep apnea, we can consider treatment with a CPAP machine or AutoPap machine.  Please remember, the risks and ramifications of moderate to severe obstructive sleep apnea or OSA are: Cardiovascular disease, including congestive heart failure, stroke, difficult to control hypertension, arrhythmias, and even type 2 diabetes has been linked to untreated OSA. Sleep apnea causes disruption of sleep and sleep deprivation in most cases, which, in turn, can cause recurrent headaches, problems with memory, mood, concentration, focus, and vigilance. Most people with untreated sleep apnea report excessive daytime sleepiness, which can affect their ability to drive. Please do not drive if you feel sleepy.   We will call you after your sleep study to advise about the results (most likely, you will hear from [facility], my nurse).    Our sleep lab administrative assistant, will call you to schedule your sleep study. If you don't hear back from her by about 2 weeks from now, please feel free to call her at [phone]. This is her direct line and please leave a message with your phone number to call back if you get the voicemail box. She will call back as soon as possible.   Your chronic insomnia may respond to cognitive behavioral therapy (CBT-I). Please talk to your therapist about this possibility.

## 2020-05-07 NOTE — Progress Notes (Signed)
Subjective:    Patient ID: Katie [name] is a 35 y.o. female.  HPI     Katie Age, MD, PhD [facility] [address], [address] [place], [place] [address]  Dear Katie [name] and Dr. Ernie [name],   I saw your patient, Katie [name], upon your kind request, in my Clinic today for initial consultation of her sleep disorder, in particular, concern for underlying obstructive sleep apnea. The patient is unaccompanied today. As you know, Katie [name] is a 35 year old right-handed woman with an underlying medical history of depression, anxiety, ADD, fibroid disease, and overweight state, who reports snoring and excessive daytime somnolence. I reviewed your office records, including note from 04/01/20. Her Epworth sleepiness score is 6/24, fatigue severity score is 42/63. Of note, she is on multiple medications including several psychotropic medications, she is followed by psychiatry.  She is currently on Xanax, Strattera, Lamictal, Provigil, lithium, topiramate.  She is no longer on Abilify.  She also has a therapist.  She reports a diagnosis of bipolar disease, OCD, ADD. She lives with her husband and 4-year-old son.  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  She is a non-smoker and does not currently drink any alcohol, drinks caffeine in the form of coffee, about 2 cups/day on average.  She reports a several year history of difficulty initiating and maintaining sleep, nonrestorative sleep, multiple nighttime awakenings.  She has some snoring.  She reports that her father has sleep apnea and has a CPAP machine.  For sleep, she has tried lorazepam, Lunesta, trazodone, Sonata, and melatonin.  Melatonin caused nightmares.  She is currently taking Xanax 0.25 mg each bedtime.  Bedtime is generally around 9 PM and rise time between 6:30 AM and 6:45 AM.  She does not have a TV in the bedroom.  Her 8-year-old sleeps fairly well in his own bedroom.  They have 1 cat  in the household.  She denies recurrent morning headaches.  Her Past Medical History Is Significant For: Past Medical History:  Diagnosis Date  . Anxiety   . Bipolar 1 disorder ([place])   . Depression   . Fibroid   . Genital herpes     Her Past Surgical History Is Significant For: Past Surgical History:  Procedure Laterality Date  . NO PAST SURGERIES      Her Family History Is Significant For: Family History  Problem Relation [name] of Onset  . Arrhythmia Mother   . Hypertension Father   . Arrhythmia Maternal Grandmother   . Diabetes Paternal Grandmother   . Hypertension Paternal Grandmother   . Diabetes Paternal Grandfather   . Hypertension Paternal Grandfather     Her Social History Is Significant For: Social History   Socioeconomic History  . Marital status: Married    Spouse name: Not on file  . Number of children: Not on file  . Years of education: Not on file  . Highest education level: Not on file  Occupational History  . Not on file  Tobacco Use  . Smoking status: Never Smoker  . Smokeless tobacco: Never Used  Substance and Sexual Activity  . Alcohol use: Yes    Alcohol/week: 0.0 standard drinks    Comment: SOCIAL   . Drug use: Not on file  . Sexual activity: Yes    Birth control/protection: None    Comment: 1ST INTERCOURSE- 18, PARTNERS- 2   Other Topics Concern  . Not on file  Social History Narrative  . Not on file  Social Determinants of Health   Financial Resource Strain: Not on file  Food Insecurity: Not on file  Transportation Needs: Not on file  Physical Activity: Not on file  Stress: Not on file  Social Connections: Not on file    Her Allergies Are:  No Known Allergies:   Her Current Medications Are:  Outpatient Encounter Medications as of 05/07/2020  Medication Sig  . ALPRAZolam (XANAX) 0.25 MG tablet Take 0.25 mg by mouth daily.  Marland Kitchen atomoxetine (STRATTERA) 40 MG capsule Take 40 mg by mouth daily.  . Cholecalciferol (VITAMIN D3)  1.25 MG (50000 UT) CAPS Take by mouth.  . lamoTRIgine (LAMICTAL) 150 MG tablet Take 150 mg by mouth daily.  Marland Kitchen lithium carbonate 300 MG capsule Take 900 mg by mouth daily.  . modafinil (PROVIGIL) 200 MG tablet Take 200 mg by mouth daily.  . Prenatal Vit-Fe Fumarate-FA (PRENATAL MULTIVITAMIN) TABS tablet Take 1 tablet by mouth at bedtime.  . topiramate (TOPAMAX) 25 MG tablet Take 25 mg by mouth 2 (two) times daily.  . valACYclovir (VALTREX) 500 MG tablet Take 500 mg by mouth daily.  . [DISCONTINUED] acetaminophen (TYLENOL) 500 MG tablet Take 1,000 mg by mouth every 6 (six) hours as needed for mild pain.  . [DISCONTINUED] ARIPiprazole (ABILIFY) 20 MG tablet Take 20 mg by mouth at bedtime.  . [DISCONTINUED] ibuprofen (ADVIL,MOTRIN) 800 MG tablet Take 1 tablet (800 mg total) by mouth every 8 (eight) hours as needed for moderate pain.  . [DISCONTINUED] lamoTRIgine (LAMICTAL) 200 MG tablet Take 200 mg by mouth daily.  . [DISCONTINUED] omega-3 acid ethyl esters (LOVAZA) 1 g capsule Take 1 g by mouth daily.   No facility-administered encounter medications on file as of 05/07/2020.  :  Review of Systems:  Out of a complete 14 point review of systems, all are reviewed and negative with the exception of these symptoms as listed below:  Review of Systems  Neurological:       Pt presents today to discuss her sleep. Pt has never had a sleep study and doesn't think she snores.   Epworth Sleepiness Scale 0= would never doze 1= slight chance of dozing 2= moderate chance of dozing 3= high chance of dozing  Sitting and reading: 0, 1 Watching TV: 0, 1 Sitting inactive in a public place (ex. Theater or meeting): 1,2 As a passenger in a car for an hour without a break: 2,3 Lying down to rest in the afternoon: 3,3 Sitting and talking to someone: 0,0 Sitting quietly after lunch (no alcohol): 0,0 In a car, while stopped in traffic: 0,0 Total:  6, 10  First score is on modafinil. Second score is off  modafinil.     Objective:  Neurological Exam  Physical Exam Physical Examination:   Vitals:   05/07/20 1232  BP: (!) 144/83  Pulse: 71    General Examination: The patient is a very pleasant 35 y.o. female in no acute distress. She appears well-developed and well-nourished and well groomed.   HEENT: Normocephalic, atraumatic, pupils are equal, round and reactive to light, extraocular tracking is good without limitation to gaze excursion or nystagmus noted.  Corrective eyeglasses in place.  Hearing is grossly intact. Face is symmetric with normal facial animation. Speech is clear with no dysarthria noted. There is no hypophonia. There is no lip, neck/head, jaw or voice tremor. Neck is supple with full range of passive and active motion. There are no carotid bruits on auscultation. Oropharynx exam reveals: mild mouth dryness, good dental  hygiene and mild airway crowding, due to small airway entry.  Mallampati class I.  Tonsils small.  Tongue protrudes centrally and palate elevates symmetrically.  Neck circumference of 12 and three-quarter inches.  She has a significant overbite.  Chest: Clear to auscultation without wheezing, rhonchi or crackles noted.  Heart: S1+S2+0, regular and normal without murmurs, rubs or gallops noted.   Abdomen: Soft, non-tender and non-distended with normal bowel sounds appreciated on auscultation.  Extremities: There is no pitting edema in the distal lower extremities bilaterally.   Skin: Warm and dry without trophic changes noted.   Musculoskeletal: exam reveals no obvious joint deformities, tenderness or joint swelling or erythema.   Neurologically:  Mental status: The patient is awake, alert and oriented in all 4 spheres. Her immediate and remote memory, attention, language skills and fund of knowledge are appropriate. There is no evidence of aphasia, agnosia, apraxia or anomia. Speech is clear with normal prosody and enunciation. Thought process is  linear. Mood is normal and affect is normal.  Cranial nerves II - XII are as described above under HEENT exam.  Motor exam: Normal bulk, strength and tone is noted. There is no tremor, Romberg is negative. Fine motor skills and coordination: grossly intact.  Cerebellar testing: No dysmetria or intention tremor. There is no truncal or gait ataxia.  Sensory exam: intact to light touch in the upper and lower extremities.  Gait, station and balance: She stands easily. No veering to one side is noted. No leaning to one side is noted. Posture is [name]-appropriate and stance is narrow based. Gait shows normal stride length and normal pace. No problems turning are noted. Tandem walk is unremarkable.                Assessment and Plan:   In summary, Katie [name] is a very pleasant 35 y.o.-year old female with an underlying medical history of depression, anxiety, ADD, fibroid disease, and overweight state, who presents for evaluation of her chronic sleep difficulties including difficulty initiating and maintaining sleep, sleep disruption, snoring, daytime somnolence.  She is advised to proceed with a sleep study to rule out an underlying organic cause for her sleep disturbance including sleep disordered breathing.  She does report a family history of sleep apnea.  She is agreeable to pursuing a sleep study and would be willing to consider treatment for sleep apnea.  She is also advised to talk to her psychiatrist and particularly her therapist about the possibility of pursuing cognitive behavioral therapy for chronic sleep difficulty.   I explained the risks and ramifications of untreated moderate to severe OSA, especially with respect to developing cardiovascular disease down the Road, including congestive heart failure, difficult to treat hypertension, cardiac arrhythmias, or stroke. Even type 2 diabetes has, in part, been linked to untreated OSA. Symptoms of untreated OSA include daytime sleepiness, memory  problems, mood irritability and mood disorder such as depression and anxiety, lack of energy, as well as recurrent headaches, especially morning headaches. We talked about trying to maintain a healthy lifestyle in general, as well as the importance of weight control. We also talked about the importance of good sleep hygiene. I recommended the following at this time: sleep study.   I explained the sleep test procedure to the patient and also outlined possible surgical and non-surgical treatment options of OSA, including the use of a custom-made dental device (which would require a referral to a specialist dentist or oral surgeon), upper airway surgical options, such as traditional UPPP  or a novel less invasive surgical option in the form of Inspire hypoglossal nerve stimulation (which would involve a referral to an ENT surgeon). I also explained the CPAP treatment option to the patient, who indicated that she would be willing to try CPAP if the need arises. I explained the importance of being compliant with PAP treatment, not only for insurance purposes but primarily to improve Her symptoms, and for the patient's long term health benefit, including to reduce Her cardiovascular risks. I answered all her questions today and the patient was in agreement. I plan to see her back after the sleep study is completed and encouraged her to call with any interim questions, concerns, problems or updates.   Thank you very much for allowing me to participate in the care of this nice patient. If I can be of any further assistance to you please do not hesitate to call me at [phone].  Sincerely,   Huston Foley, MD, PhD

## 2020-05-13 ENCOUNTER — Telehealth: Payer: Self-pay

## 2020-05-13 DIAGNOSIS — F3176 Bipolar disorder, in full remission, most recent episode depressed: Secondary | ICD-10-CM | POA: Diagnosis not present

## 2020-05-13 NOTE — Telephone Encounter (Signed)
Calling to schedule home sleep test. 

## 2020-05-14 DIAGNOSIS — F428 Other obsessive-compulsive disorder: Secondary | ICD-10-CM | POA: Diagnosis not present

## 2020-05-14 DIAGNOSIS — F4312 Post-traumatic stress disorder, chronic: Secondary | ICD-10-CM | POA: Diagnosis not present

## 2020-05-14 DIAGNOSIS — F900 Attention-deficit hyperactivity disorder, predominantly inattentive type: Secondary | ICD-10-CM | POA: Diagnosis not present

## 2020-05-14 DIAGNOSIS — F3181 Bipolar II disorder: Secondary | ICD-10-CM | POA: Diagnosis not present

## 2020-05-20 DIAGNOSIS — F900 Attention-deficit hyperactivity disorder, predominantly inattentive type: Secondary | ICD-10-CM | POA: Diagnosis not present

## 2020-05-20 DIAGNOSIS — F4312 Post-traumatic stress disorder, chronic: Secondary | ICD-10-CM | POA: Diagnosis not present

## 2020-05-20 DIAGNOSIS — F428 Other obsessive-compulsive disorder: Secondary | ICD-10-CM | POA: Diagnosis not present

## 2020-05-20 DIAGNOSIS — F3181 Bipolar II disorder: Secondary | ICD-10-CM | POA: Diagnosis not present

## 2020-05-20 DIAGNOSIS — F3176 Bipolar disorder, in full remission, most recent episode depressed: Secondary | ICD-10-CM | POA: Diagnosis not present

## 2020-05-28 ENCOUNTER — Ambulatory Visit (INDEPENDENT_AMBULATORY_CARE_PROVIDER_SITE_OTHER): Payer: BC Managed Care – PPO | Admitting: Neurology

## 2020-05-28 DIAGNOSIS — Z820 Family history of epilepsy and other diseases of the nervous system: Secondary | ICD-10-CM

## 2020-05-28 DIAGNOSIS — F4312 Post-traumatic stress disorder, chronic: Secondary | ICD-10-CM | POA: Diagnosis not present

## 2020-05-28 DIAGNOSIS — G4700 Insomnia, unspecified: Secondary | ICD-10-CM

## 2020-05-28 DIAGNOSIS — F39 Unspecified mood [affective] disorder: Secondary | ICD-10-CM

## 2020-05-28 DIAGNOSIS — R351 Nocturia: Secondary | ICD-10-CM

## 2020-05-28 DIAGNOSIS — F428 Other obsessive-compulsive disorder: Secondary | ICD-10-CM | POA: Diagnosis not present

## 2020-05-28 DIAGNOSIS — R0683 Snoring: Secondary | ICD-10-CM

## 2020-05-28 DIAGNOSIS — G4733 Obstructive sleep apnea (adult) (pediatric): Secondary | ICD-10-CM | POA: Diagnosis not present

## 2020-05-28 DIAGNOSIS — F3181 Bipolar II disorder: Secondary | ICD-10-CM | POA: Diagnosis not present

## 2020-05-28 DIAGNOSIS — F900 Attention-deficit hyperactivity disorder, predominantly inattentive type: Secondary | ICD-10-CM | POA: Diagnosis not present

## 2020-05-28 DIAGNOSIS — G4719 Other hypersomnia: Secondary | ICD-10-CM

## 2020-05-28 DIAGNOSIS — R002 Palpitations: Secondary | ICD-10-CM

## 2020-05-30 DIAGNOSIS — F3176 Bipolar disorder, in full remission, most recent episode depressed: Secondary | ICD-10-CM | POA: Diagnosis not present

## 2020-06-03 ENCOUNTER — Telehealth: Payer: Self-pay

## 2020-06-03 NOTE — Telephone Encounter (Signed)
-----   Message from Star Age, MD sent at 06/03/2020  8:05 AM EST ----- Patient referred by Dr. Ernie Hew, seen by me on 05/07/20, HST on 05/28/20.    Please call and notify the patient that the recent home sleep test showed obstructive sleep apnea. OSA is overall mild, or even borderline, but would be worth treating to see if she feels better after treatment. To that end I recommend treatment for this in the form of autoPAP, which means, that we don't have to bring her in for a sleep study with CPAP, but will let her try an autoPAP machine at home, through a DME company (of her choice, or as per insurance requirement). The DME representative will educate her on how to use the machine, how to put the mask on, etc. I have not put an order in yet, please let me know, how she would like to proceed. Alternative treatment could be some weight loss and avoiding the supine sleep position or a dental device. If she would like to pursue dental treatment, I can place a referral to dentistry, a dentist of her choosing, or we can refer to one of the practices we know participate in OSA treatment. Again, let me know.   Star Age, MD, PhD [facility])

## 2020-06-03 NOTE — Progress Notes (Signed)
Patient referred by Dr. Ernie Hew, seen by me on 05/07/20, HST on 05/28/20.    Please call and notify the patient that the recent home sleep test showed obstructive sleep apnea. OSA is overall mild, or even borderline, but would be worth treating to see if she feels better after treatment. To that end I recommend treatment for this in the form of autoPAP, which means, that we don't have to bring her in for a sleep study with CPAP, but will let her try an autoPAP machine at home, through a DME company (of her choice, or as per insurance requirement). The DME representative will educate her on how to use the machine, how to put the mask on, etc. I have not put an order in yet, please let me know, how she would like to proceed. Alternative treatment could be some weight loss and avoiding the supine sleep position or a dental device. If she would like to pursue dental treatment, I can place a referral to dentistry, a dentist of her choosing, or we can refer to one of the practices we know participate in OSA treatment. Again, let me know.   Star Age, MD, PhD [facility])

## 2020-06-03 NOTE — Telephone Encounter (Signed)
I called pt. No answer, left a message asking pt to call me back.   

## 2020-06-03 NOTE — Procedures (Signed)
   [facility]   HOME SLEEP TEST (Watch PAT)  STUDY DATE: 05/28/20  DOB: [birth year]  MRN: [id number]  ORDERING CLINICIAN: Star Age, MD, PhD   REFERRING CLINICIAN: Dr. Ernie Hew   CLINICAL INFORMATION/HISTORY: 35 year old woman with a medical history of depression, anxiety, ADD, fibroid disease, and overweight state, who reports snoring and excessive daytime somnolence. She reports a several year history of difficulty initiating and maintaining sleep, nonrestorative sleep, multiple nighttime awakenings.   Epworth sleepiness score: 6/24.  BMI: 27.5 kg/m  Neck Circumference: 13 "  FINDINGS:   Total Record Time (hours, min): 9 h 11 min  Total Sleep Time (hours, min):  8 h 30 min   Percent REM (%):    35.09 %   Calculated pAHI (per hour): 6.3       REM pAHI: 16.4     NREM pAHI: 1.0 Supine AHI: N/A   Oxygen Saturation (%) Mean: 96  Minimum oxygen saturation (%):        90   O2 Saturation Range (%): 90-96  O2Saturation (minutes) <=88%: 0 min   Pulse Mean (bpm):    73  Pulse Range (47-109)   IMPRESSION: OSA (obstructive sleep apnea)  RECOMMENDATION:  This HST shows mild or borderline obstructive sleep apnea, with an AHI of 6.3/hour and O2 nadir of 90%. Treatment with positive airway pressure can be considered with autoPAP, if desired by patient. Treatment options otherwise include weight loss and avoidance of the supine sleep position or a dental device. These different avenues will be discussed with the patient. The patient will be seen in follow up in sleep clinic, if necessary. Please note, that other causes of the patient's symptoms, including circadian rhythm disturbances, an underlying mood disorder, medication effect and/or an underlying medical problem cannot be ruled out based on this test. Clinical correlation is recommended. The patient should be cautioned not to drive, work at heights, or operate dangerous or heavy equipment when tired or sleepy. Review and reiteration  of good sleep hygiene measures should be pursued with any patient. The referring provider will be notified of the test results.   I certify that I have reviewed the raw data recording prior to the issuance of this report in accordance with the standards of the American Academy of Sleep Medicine (AASM).  INTERPRETING PHYSICIAN:  Star Age, MD, PhD  Board Certified in Neurology and Sleep Medicine [facility] [address], [place], [place] [address] [phone]

## 2020-06-03 NOTE — Telephone Encounter (Signed)
Pt is asking for a call back from [facility] with results, around 12 may be of a good time to reach pt.

## 2020-06-04 NOTE — Telephone Encounter (Signed)
Sounds good, please place a referral to dentistry if she requests a referral from our end.

## 2020-06-04 NOTE — Telephone Encounter (Signed)
I called the patient and we discussed the results of her sleep study.  Patient at this time would like to proceed with the dental device.  She is going to check with her primary dentist and see if they have the capability of fitting her for the oral appliance and call back. If referral is needed she will call back and we can sent to either Dr. Ron Parker or Dr. Toy Cookey.

## 2020-06-04 NOTE — Telephone Encounter (Signed)
Noted  

## 2020-06-10 ENCOUNTER — Telehealth: Payer: Self-pay | Admitting: Neurology

## 2020-06-10 DIAGNOSIS — F3176 Bipolar disorder, in full remission, most recent episode depressed: Secondary | ICD-10-CM | POA: Diagnosis not present

## 2020-06-10 DIAGNOSIS — G4719 Other hypersomnia: Secondary | ICD-10-CM

## 2020-06-10 DIAGNOSIS — G4733 Obstructive sleep apnea (adult) (pediatric): Secondary | ICD-10-CM

## 2020-06-10 NOTE — Addendum Note (Signed)
Addended by: Verlin Grills on: 06/10/2020 10:23 AM   Modules accepted: Orders

## 2020-06-10 NOTE — Telephone Encounter (Signed)
Pt. is asking for a recommendation for another dentist office that does a dental device for sleep apnea. She states the one that she goes to does not. Please advise.

## 2020-06-10 NOTE — Telephone Encounter (Signed)
I called the patient and advised we have placed referral to the dentist office for oral appliance fitting.

## 2020-06-11 DIAGNOSIS — F4312 Post-traumatic stress disorder, chronic: Secondary | ICD-10-CM | POA: Diagnosis not present

## 2020-06-11 DIAGNOSIS — F900 Attention-deficit hyperactivity disorder, predominantly inattentive type: Secondary | ICD-10-CM | POA: Diagnosis not present

## 2020-06-11 DIAGNOSIS — F3181 Bipolar II disorder: Secondary | ICD-10-CM | POA: Diagnosis not present

## 2020-06-11 DIAGNOSIS — F428 Other obsessive-compulsive disorder: Secondary | ICD-10-CM | POA: Diagnosis not present

## 2020-06-11 NOTE — Telephone Encounter (Signed)
Thanks Jinny Blossom I will bring Referral form For Dr. Rexene Alberts to sign for Dr. Ron Parker .

## 2020-06-17 DIAGNOSIS — F3132 Bipolar disorder, current episode depressed, moderate: Secondary | ICD-10-CM | POA: Diagnosis not present

## 2020-06-18 DIAGNOSIS — F3132 Bipolar disorder, current episode depressed, moderate: Secondary | ICD-10-CM | POA: Diagnosis not present

## 2020-06-19 DIAGNOSIS — F3132 Bipolar disorder, current episode depressed, moderate: Secondary | ICD-10-CM | POA: Diagnosis not present

## 2020-06-20 DIAGNOSIS — F3132 Bipolar disorder, current episode depressed, moderate: Secondary | ICD-10-CM | POA: Diagnosis not present

## 2020-06-23 DIAGNOSIS — F3132 Bipolar disorder, current episode depressed, moderate: Secondary | ICD-10-CM | POA: Diagnosis not present

## 2020-06-24 DIAGNOSIS — F3132 Bipolar disorder, current episode depressed, moderate: Secondary | ICD-10-CM | POA: Diagnosis not present

## 2020-06-25 DIAGNOSIS — F3132 Bipolar disorder, current episode depressed, moderate: Secondary | ICD-10-CM | POA: Diagnosis not present

## 2020-06-26 DIAGNOSIS — F3132 Bipolar disorder, current episode depressed, moderate: Secondary | ICD-10-CM | POA: Diagnosis not present

## 2020-06-27 DIAGNOSIS — F3132 Bipolar disorder, current episode depressed, moderate: Secondary | ICD-10-CM | POA: Diagnosis not present

## 2020-06-30 DIAGNOSIS — F3132 Bipolar disorder, current episode depressed, moderate: Secondary | ICD-10-CM | POA: Diagnosis not present

## 2020-07-01 DIAGNOSIS — F3132 Bipolar disorder, current episode depressed, moderate: Secondary | ICD-10-CM | POA: Diagnosis not present

## 2020-07-02 DIAGNOSIS — F3132 Bipolar disorder, current episode depressed, moderate: Secondary | ICD-10-CM | POA: Diagnosis not present

## 2020-07-03 DIAGNOSIS — F3132 Bipolar disorder, current episode depressed, moderate: Secondary | ICD-10-CM | POA: Diagnosis not present

## 2020-07-04 DIAGNOSIS — F3132 Bipolar disorder, current episode depressed, moderate: Secondary | ICD-10-CM | POA: Diagnosis not present

## 2020-07-07 DIAGNOSIS — F3132 Bipolar disorder, current episode depressed, moderate: Secondary | ICD-10-CM | POA: Diagnosis not present

## 2020-07-08 DIAGNOSIS — F3132 Bipolar disorder, current episode depressed, moderate: Secondary | ICD-10-CM | POA: Diagnosis not present

## 2020-07-09 DIAGNOSIS — F3132 Bipolar disorder, current episode depressed, moderate: Secondary | ICD-10-CM | POA: Diagnosis not present

## 2020-07-10 DIAGNOSIS — F3132 Bipolar disorder, current episode depressed, moderate: Secondary | ICD-10-CM | POA: Diagnosis not present

## 2020-07-11 DIAGNOSIS — F3132 Bipolar disorder, current episode depressed, moderate: Secondary | ICD-10-CM | POA: Diagnosis not present

## 2020-07-14 DIAGNOSIS — F3132 Bipolar disorder, current episode depressed, moderate: Secondary | ICD-10-CM | POA: Diagnosis not present

## 2020-07-15 DIAGNOSIS — F3132 Bipolar disorder, current episode depressed, moderate: Secondary | ICD-10-CM | POA: Diagnosis not present

## 2020-07-16 DIAGNOSIS — F3132 Bipolar disorder, current episode depressed, moderate: Secondary | ICD-10-CM | POA: Diagnosis not present

## 2020-07-17 DIAGNOSIS — F3132 Bipolar disorder, current episode depressed, moderate: Secondary | ICD-10-CM | POA: Diagnosis not present

## 2020-07-18 DIAGNOSIS — F3132 Bipolar disorder, current episode depressed, moderate: Secondary | ICD-10-CM | POA: Diagnosis not present

## 2020-07-21 DIAGNOSIS — F3132 Bipolar disorder, current episode depressed, moderate: Secondary | ICD-10-CM | POA: Diagnosis not present

## 2020-07-22 DIAGNOSIS — F3132 Bipolar disorder, current episode depressed, moderate: Secondary | ICD-10-CM | POA: Diagnosis not present

## 2020-07-23 DIAGNOSIS — F3132 Bipolar disorder, current episode depressed, moderate: Secondary | ICD-10-CM | POA: Diagnosis not present

## 2020-07-24 DIAGNOSIS — F3132 Bipolar disorder, current episode depressed, moderate: Secondary | ICD-10-CM | POA: Diagnosis not present

## 2020-07-25 DIAGNOSIS — F3132 Bipolar disorder, current episode depressed, moderate: Secondary | ICD-10-CM | POA: Diagnosis not present

## 2020-07-28 DIAGNOSIS — F3132 Bipolar disorder, current episode depressed, moderate: Secondary | ICD-10-CM | POA: Diagnosis not present

## 2020-07-29 DIAGNOSIS — F3132 Bipolar disorder, current episode depressed, moderate: Secondary | ICD-10-CM | POA: Diagnosis not present

## 2020-07-30 DIAGNOSIS — F3132 Bipolar disorder, current episode depressed, moderate: Secondary | ICD-10-CM | POA: Diagnosis not present

## 2020-07-31 DIAGNOSIS — F3132 Bipolar disorder, current episode depressed, moderate: Secondary | ICD-10-CM | POA: Diagnosis not present

## 2020-08-01 DIAGNOSIS — F3132 Bipolar disorder, current episode depressed, moderate: Secondary | ICD-10-CM | POA: Diagnosis not present

## 2020-08-04 DIAGNOSIS — F3132 Bipolar disorder, current episode depressed, moderate: Secondary | ICD-10-CM | POA: Diagnosis not present

## 2020-08-05 DIAGNOSIS — F3132 Bipolar disorder, current episode depressed, moderate: Secondary | ICD-10-CM | POA: Diagnosis not present

## 2020-08-06 DIAGNOSIS — F3132 Bipolar disorder, current episode depressed, moderate: Secondary | ICD-10-CM | POA: Diagnosis not present

## 2020-08-07 DIAGNOSIS — F3132 Bipolar disorder, current episode depressed, moderate: Secondary | ICD-10-CM | POA: Diagnosis not present

## 2020-08-08 DIAGNOSIS — F3132 Bipolar disorder, current episode depressed, moderate: Secondary | ICD-10-CM | POA: Diagnosis not present

## 2020-08-11 DIAGNOSIS — F3132 Bipolar disorder, current episode depressed, moderate: Secondary | ICD-10-CM | POA: Diagnosis not present

## 2020-08-12 DIAGNOSIS — F319 Bipolar disorder, unspecified: Secondary | ICD-10-CM | POA: Diagnosis not present

## 2020-08-13 DIAGNOSIS — F3176 Bipolar disorder, in full remission, most recent episode depressed: Secondary | ICD-10-CM | POA: Diagnosis not present

## 2020-08-20 DIAGNOSIS — Z01419 Encounter for gynecological examination (general) (routine) without abnormal findings: Secondary | ICD-10-CM | POA: Diagnosis not present

## 2020-08-20 DIAGNOSIS — Z309 Encounter for contraceptive management, unspecified: Secondary | ICD-10-CM | POA: Diagnosis not present

## 2020-08-20 DIAGNOSIS — Z6825 Body mass index (BMI) 25.0-25.9, adult: Secondary | ICD-10-CM | POA: Diagnosis not present

## 2020-08-27 DIAGNOSIS — F3176 Bipolar disorder, in full remission, most recent episode depressed: Secondary | ICD-10-CM | POA: Diagnosis not present

## 2020-08-28 DIAGNOSIS — F319 Bipolar disorder, unspecified: Secondary | ICD-10-CM | POA: Diagnosis not present

## 2020-09-05 DIAGNOSIS — F3176 Bipolar disorder, in full remission, most recent episode depressed: Secondary | ICD-10-CM | POA: Diagnosis not present

## 2020-09-10 DIAGNOSIS — F3176 Bipolar disorder, in full remission, most recent episode depressed: Secondary | ICD-10-CM | POA: Diagnosis not present

## 2020-09-17 DIAGNOSIS — F3176 Bipolar disorder, in full remission, most recent episode depressed: Secondary | ICD-10-CM | POA: Diagnosis not present

## 2020-09-17 DIAGNOSIS — F319 Bipolar disorder, unspecified: Secondary | ICD-10-CM | POA: Diagnosis not present

## 2020-09-17 DIAGNOSIS — F649 Gender identity disorder, unspecified: Secondary | ICD-10-CM | POA: Diagnosis not present

## 2020-09-25 DIAGNOSIS — F411 Generalized anxiety disorder: Secondary | ICD-10-CM | POA: Diagnosis not present

## 2020-09-26 DIAGNOSIS — F319 Bipolar disorder, unspecified: Secondary | ICD-10-CM | POA: Diagnosis not present

## 2020-09-26 DIAGNOSIS — F649 Gender identity disorder, unspecified: Secondary | ICD-10-CM | POA: Diagnosis not present

## 2020-10-01 DIAGNOSIS — F3176 Bipolar disorder, in full remission, most recent episode depressed: Secondary | ICD-10-CM | POA: Diagnosis not present

## 2020-10-09 DIAGNOSIS — G4733 Obstructive sleep apnea (adult) (pediatric): Secondary | ICD-10-CM | POA: Diagnosis not present

## 2020-10-12 DIAGNOSIS — N809 Endometriosis, unspecified: Secondary | ICD-10-CM | POA: Insufficient documentation

## 2020-10-12 DIAGNOSIS — Z03818 Encounter for observation for suspected exposure to other biological agents ruled out: Secondary | ICD-10-CM | POA: Diagnosis not present

## 2020-10-12 DIAGNOSIS — Z20822 Contact with and (suspected) exposure to covid-19: Secondary | ICD-10-CM | POA: Diagnosis not present

## 2020-10-12 DIAGNOSIS — F4310 Post-traumatic stress disorder, unspecified: Secondary | ICD-10-CM | POA: Insufficient documentation

## 2020-10-12 DIAGNOSIS — R059 Cough, unspecified: Secondary | ICD-10-CM | POA: Diagnosis not present

## 2020-10-14 DIAGNOSIS — Z8709 Personal history of other diseases of the respiratory system: Secondary | ICD-10-CM | POA: Diagnosis not present

## 2020-10-14 DIAGNOSIS — J329 Chronic sinusitis, unspecified: Secondary | ICD-10-CM | POA: Diagnosis not present

## 2020-10-14 DIAGNOSIS — B9689 Other specified bacterial agents as the cause of diseases classified elsewhere: Secondary | ICD-10-CM | POA: Diagnosis not present

## 2020-10-17 DIAGNOSIS — J069 Acute upper respiratory infection, unspecified: Secondary | ICD-10-CM | POA: Diagnosis not present

## 2020-10-17 DIAGNOSIS — B9689 Other specified bacterial agents as the cause of diseases classified elsewhere: Secondary | ICD-10-CM | POA: Diagnosis not present

## 2020-10-17 DIAGNOSIS — J329 Chronic sinusitis, unspecified: Secondary | ICD-10-CM | POA: Diagnosis not present

## 2020-10-22 DIAGNOSIS — F3176 Bipolar disorder, in full remission, most recent episode depressed: Secondary | ICD-10-CM | POA: Diagnosis not present

## 2020-10-22 DIAGNOSIS — J019 Acute sinusitis, unspecified: Secondary | ICD-10-CM | POA: Diagnosis not present

## 2020-10-22 DIAGNOSIS — R058 Other specified cough: Secondary | ICD-10-CM | POA: Diagnosis not present

## 2020-10-22 DIAGNOSIS — J329 Chronic sinusitis, unspecified: Secondary | ICD-10-CM | POA: Diagnosis not present

## 2020-10-23 DIAGNOSIS — F411 Generalized anxiety disorder: Secondary | ICD-10-CM | POA: Diagnosis not present

## 2020-10-30 DIAGNOSIS — F649 Gender identity disorder, unspecified: Secondary | ICD-10-CM | POA: Diagnosis not present

## 2020-10-30 DIAGNOSIS — F319 Bipolar disorder, unspecified: Secondary | ICD-10-CM | POA: Diagnosis not present

## 2020-11-05 DIAGNOSIS — F3176 Bipolar disorder, in full remission, most recent episode depressed: Secondary | ICD-10-CM | POA: Diagnosis not present

## 2020-11-12 DIAGNOSIS — F3176 Bipolar disorder, in full remission, most recent episode depressed: Secondary | ICD-10-CM | POA: Diagnosis not present

## 2020-11-19 DIAGNOSIS — F3176 Bipolar disorder, in full remission, most recent episode depressed: Secondary | ICD-10-CM | POA: Diagnosis not present

## 2020-11-24 DIAGNOSIS — F649 Gender identity disorder, unspecified: Secondary | ICD-10-CM | POA: Diagnosis not present

## 2020-11-24 DIAGNOSIS — F319 Bipolar disorder, unspecified: Secondary | ICD-10-CM | POA: Diagnosis not present

## 2020-11-27 DIAGNOSIS — Z719 Counseling, unspecified: Secondary | ICD-10-CM | POA: Diagnosis not present

## 2020-11-27 DIAGNOSIS — F3176 Bipolar disorder, in full remission, most recent episode depressed: Secondary | ICD-10-CM | POA: Diagnosis not present

## 2020-12-03 DIAGNOSIS — F411 Generalized anxiety disorder: Secondary | ICD-10-CM | POA: Diagnosis not present

## 2020-12-04 DIAGNOSIS — F649 Gender identity disorder, unspecified: Secondary | ICD-10-CM | POA: Diagnosis not present

## 2020-12-04 DIAGNOSIS — F319 Bipolar disorder, unspecified: Secondary | ICD-10-CM | POA: Diagnosis not present

## 2020-12-09 DIAGNOSIS — F3176 Bipolar disorder, in full remission, most recent episode depressed: Secondary | ICD-10-CM | POA: Diagnosis not present

## 2020-12-17 DIAGNOSIS — E559 Vitamin D deficiency, unspecified: Secondary | ICD-10-CM | POA: Diagnosis not present

## 2020-12-17 DIAGNOSIS — R631 Polydipsia: Secondary | ICD-10-CM | POA: Diagnosis not present

## 2020-12-17 DIAGNOSIS — J309 Allergic rhinitis, unspecified: Secondary | ICD-10-CM | POA: Diagnosis not present

## 2020-12-17 DIAGNOSIS — F3176 Bipolar disorder, in full remission, most recent episode depressed: Secondary | ICD-10-CM | POA: Diagnosis not present

## 2020-12-18 DIAGNOSIS — F649 Gender identity disorder, unspecified: Secondary | ICD-10-CM | POA: Diagnosis not present

## 2020-12-18 DIAGNOSIS — F319 Bipolar disorder, unspecified: Secondary | ICD-10-CM | POA: Diagnosis not present

## 2020-12-18 DIAGNOSIS — Z8659 Personal history of other mental and behavioral disorders: Secondary | ICD-10-CM | POA: Diagnosis not present

## 2020-12-24 DIAGNOSIS — F3176 Bipolar disorder, in full remission, most recent episode depressed: Secondary | ICD-10-CM | POA: Diagnosis not present

## 2020-12-29 DIAGNOSIS — R5383 Other fatigue: Secondary | ICD-10-CM | POA: Diagnosis not present

## 2020-12-29 DIAGNOSIS — R350 Frequency of micturition: Secondary | ICD-10-CM | POA: Diagnosis not present

## 2020-12-29 DIAGNOSIS — N939 Abnormal uterine and vaginal bleeding, unspecified: Secondary | ICD-10-CM | POA: Diagnosis not present

## 2020-12-29 DIAGNOSIS — Z113 Encounter for screening for infections with a predominantly sexual mode of transmission: Secondary | ICD-10-CM | POA: Diagnosis not present

## 2020-12-31 DIAGNOSIS — F3176 Bipolar disorder, in full remission, most recent episode depressed: Secondary | ICD-10-CM | POA: Diagnosis not present

## 2021-01-01 DIAGNOSIS — F649 Gender identity disorder, unspecified: Secondary | ICD-10-CM | POA: Diagnosis not present

## 2021-01-01 DIAGNOSIS — G4730 Sleep apnea, unspecified: Secondary | ICD-10-CM | POA: Diagnosis not present

## 2021-01-01 DIAGNOSIS — Z8659 Personal history of other mental and behavioral disorders: Secondary | ICD-10-CM | POA: Diagnosis not present

## 2021-01-01 DIAGNOSIS — F319 Bipolar disorder, unspecified: Secondary | ICD-10-CM | POA: Diagnosis not present

## 2021-01-07 DIAGNOSIS — Z30431 Encounter for routine checking of intrauterine contraceptive device: Secondary | ICD-10-CM | POA: Diagnosis not present

## 2021-01-07 DIAGNOSIS — N939 Abnormal uterine and vaginal bleeding, unspecified: Secondary | ICD-10-CM | POA: Diagnosis not present

## 2021-01-08 ENCOUNTER — Emergency Department (HOSPITAL_COMMUNITY): Payer: BC Managed Care – PPO

## 2021-01-08 ENCOUNTER — Other Ambulatory Visit: Payer: Self-pay

## 2021-01-08 ENCOUNTER — Encounter (HOSPITAL_COMMUNITY): Payer: Self-pay

## 2021-01-08 ENCOUNTER — Emergency Department (HOSPITAL_COMMUNITY)
Admission: EM | Admit: 2021-01-08 | Discharge: 2021-01-09 | Disposition: A | Discharge status: Home / Self Care | Payer: BC Managed Care – PPO | Attending: Emergency Medicine | Admitting: Emergency Medicine

## 2021-01-08 DIAGNOSIS — R110 Nausea: Secondary | ICD-10-CM | POA: Insufficient documentation

## 2021-01-08 DIAGNOSIS — R197 Diarrhea, unspecified: Secondary | ICD-10-CM | POA: Diagnosis not present

## 2021-01-08 DIAGNOSIS — I878 Other specified disorders of veins: Secondary | ICD-10-CM | POA: Diagnosis not present

## 2021-01-08 DIAGNOSIS — R1084 Generalized abdominal pain: Secondary | ICD-10-CM | POA: Insufficient documentation

## 2021-01-08 DIAGNOSIS — R55 Syncope and collapse: Secondary | ICD-10-CM | POA: Diagnosis not present

## 2021-01-08 DIAGNOSIS — R109 Unspecified abdominal pain: Secondary | ICD-10-CM | POA: Diagnosis not present

## 2021-01-08 LAB — COMPREHENSIVE METABOLIC PANEL
ALT: 15 U/L (ref 0–44)
AST: 18 U/L (ref 15–41)
Albumin: 3.7 g/dL (ref 3.5–5.0)
Alkaline Phosphatase: 75 U/L (ref 38–126)
Anion gap: 4 — ABNORMAL LOW (ref 5–15)
BUN: 7 mg/dL (ref 6–20)
CO2: 24 mmol/L (ref 22–32)
Calcium: 9.1 mg/dL (ref 8.9–10.3)
Chloride: 112 mmol/L — ABNORMAL HIGH (ref 98–111)
Creatinine, Ser: 0.81 mg/dL (ref 0.44–1.00)
GFR, Estimated: >60 mL/min (ref >60)
Glucose, Bld: 135 mg/dL — ABNORMAL HIGH (ref 70–99)
Potassium: 3.6 mmol/L (ref 3.5–5.1)
Sodium: 140 mmol/L (ref 135–145)
Total Bilirubin: 0.4 mg/dL (ref 0.3–1.2)
Total Protein: 6.9 g/dL (ref 6.5–8.1)

## 2021-01-08 LAB — LIPASE, BLOOD: Lipase: 28 U/L (ref 11–51)

## 2021-01-08 LAB — CBC WITH DIFFERENTIAL/PLATELET
Abs Immature Granulocytes: 0.05 10*3/uL (ref 0.00–0.07)
Basophils Absolute: 0 10*3/uL (ref 0.0–0.1)
Basophils Relative: 0 %
Eosinophils Absolute: 0.1 10*3/uL (ref 0.0–0.5)
Eosinophils Relative: 1 %
HCT: 43 % (ref 36.0–46.0)
Hemoglobin: 13.6 g/dL (ref 12.0–15.0)
Immature Granulocytes: 0 %
Lymphocytes Relative: 19 %
Lymphs Abs: 2.7 10*3/uL (ref 0.7–4.0)
MCH: 31.9 pg (ref 26.0–34.0)
MCHC: 31.6 g/dL (ref 30.0–36.0)
MCV: 100.7 fL — ABNORMAL HIGH (ref 80.0–100.0)
Monocytes Absolute: 1 10*3/uL (ref 0.1–1.0)
Monocytes Relative: 7 %
Neutro Abs: 10 10*3/uL — ABNORMAL HIGH (ref 1.7–7.7)
Neutrophils Relative %: 73 %
Platelets: 361 10*3/uL (ref 150–400)
RBC: 4.27 MIL/uL (ref 3.87–5.11)
RDW: 11.9 % (ref 11.5–15.5)
WBC: 13.8 10*3/uL — ABNORMAL HIGH (ref 4.0–10.5)
nRBC: 0 % (ref 0.0–0.2)

## 2021-01-08 LAB — HCG, QUANTITATIVE, PREGNANCY: hCG, Beta Chain, Quant, S: 1 m[IU]/mL (ref <5)

## 2021-01-08 LAB — LACTIC ACID, PLASMA: Lactic Acid, Venous: 1.2 mmol/L (ref 0.5–1.9)

## 2021-01-08 MED ORDER — SODIUM CHLORIDE 0.9 % IV BOLUS
1000.0000 mL | Freq: Once | INTRAVENOUS | Status: AC
  Administered 2021-01-08: 1000 mL via INTRAVENOUS

## 2021-01-08 MED ORDER — IOHEXOL 350 MG/ML SOLN
80.0000 mL | Freq: Once | INTRAVENOUS | Status: AC | PRN
  Administered 2021-01-09: 80 mL via INTRAVENOUS

## 2021-01-08 NOTE — ED Triage Notes (Signed)
Pt reports abdominal pain and nausea x 24 hours. During MSE pt had a syncopal episode. BP dropped to 71/50 and decreased LOC.

## 2021-01-08 NOTE — ED Provider Notes (Signed)
[facility] DEPT Provider Note   CSN: [id number] Arrival date & time: 01/08/21  2156     History Chief Complaint  Patient presents with   Abdominal Pain    Katie [name] is a 35 y.o. female presents to the emergency department with 24 hours of generalized abdominal pain.  Patient reports the pain is in her lower abdomen and radiates up into her bilateral abdominal sides but not into her flank.  States she has had nausea without vomiting or diarrhea.  Pain was gradual onset has been persistent and is progressively worsening.  Patient describes it as a burning sensation in her GI tract and abdominal wall.  She reports sitting and standing makes it worse.  No history of abdominal surgery but does have an IUD in place.  Had an ultrasound 2 days ago to confirm placement and reports negative pregnancy test at that time.  No known sick contacts.  Denies back pain, shortness of breath, dizziness or syncope.  The history is provided by the patient and medical records. No language interpreter was used.      Past Medical History:  Diagnosis Date   Anxiety    Bipolar 1 disorder ([place])    Depression    Fibroid    Genital herpes     Patient Active Problem List   Diagnosis Date Noted   Indication for care in labor or delivery 01/19/2017   Preterm labor 01/10/2017    Past Surgical History:  Procedure Laterality Date   NO PAST SURGERIES       OB History     Gravida  1   Para  1   Term  0   Preterm  1   AB  0   Living  1      SAB  0   IAB  0   Ectopic  0   Multiple  0   Live Births  1           Family History  Problem Relation Age of Onset   Arrhythmia Mother    Hypertension Father    Arrhythmia Maternal Grandmother    Diabetes Paternal Grandmother    Hypertension Paternal Grandmother    Diabetes Paternal Grandfather    Hypertension Paternal Grandfather     Social History   Tobacco Use   Smoking status: Never    Smokeless tobacco: Never  Substance Use Topics   Alcohol use: Yes    Alcohol/week: 0.0 standard drinks    Comment: SOCIAL     Home Medications Prior to Admission medications   Medication Sig Start Date End Date Taking? Authorizing Provider  ALPRAZolam (XANAX) 0.25 MG tablet Take 0.25 mg by mouth daily.   Yes [provider]  Cholecalciferol (VITAMIN D3) 1.25 MG (50000 UT) CAPS Take by mouth.   Yes [provider]  HYDROcodone-acetaminophen (NORCO/VICODIN) 5-325 MG tablet Take 1 tablet by mouth every 6 (six) hours as needed. 01/09/21  Yes Geanette Buonocore, Jarrett Soho, PA-C  lamoTRIgine (LAMICTAL) 100 MG tablet Take 200 mg by mouth daily.   Yes [provider]  lithium carbonate 300 MG capsule Take 900 mg by mouth daily.   Yes [provider]  ondansetron (ZOFRAN) 4 MG tablet Take 1 tablet (4 mg total) by mouth every 8 (eight) hours as needed for nausea or vomiting. 01/09/21  Yes Emma Schupp, Jarrett Soho, PA-C  Prenatal Vit-Fe Fumarate-FA (PRENATAL MULTIVITAMIN) TABS tablet Take 1 tablet by mouth at bedtime.   Yes [provider]  QUEtiapine (  SEROQUEL) 25 MG tablet Take 25 mg by mouth at bedtime. 01/06/21  Yes [provider]  topiramate (TOPAMAX) 50 MG tablet Take 50 mg by mouth 2 (two) times daily.   Yes [provider]  valACYclovir (VALTREX) 500 MG tablet Take 500 mg by mouth daily.   Yes [provider]  atomoxetine (STRATTERA) 40 MG capsule Take 40 mg by mouth daily. Patient not taking: Reported on 01/09/2021    [provider]  modafinil (PROVIGIL) 200 MG tablet Take 200 mg by mouth daily. Patient not taking: Reported on 01/09/2021    [provider]    Allergies    Patient has no known allergies.  Review of Systems   Review of Systems  HENT:  Negative for mouth sores.   Respiratory:  Negative for chest tightness and wheezing.    Physical Exam Updated Vital Signs BP 124/82   Pulse 78   Temp 100.1 F (37.8  C) (Rectal)   Resp (!) 22   Ht '5\' 5"'$  (1.651 m)   Wt 66.7 kg   SpO2 96%   BMI 24.46 kg/m   Physical Exam Vitals and nursing note reviewed.  Constitutional:      General: She is not in acute distress.    Appearance: She is ill-appearing and diaphoretic.  HENT:     Head: Normocephalic.  Eyes:     General: No scleral icterus.    Conjunctiva/sclera: Conjunctivae normal.  Cardiovascular:     Rate and Rhythm: Regular rhythm.     Pulses: Normal pulses.          Radial pulses are 2+ on the right side and 2+ on the left side.  Pulmonary:     Effort: No tachypnea, accessory muscle usage, prolonged expiration, respiratory distress or retractions.     Breath sounds: No stridor.     Comments: Equal chest rise. No increased work of breathing. Abdominal:     General: There is no distension.     Palpations: Abdomen is soft.     Tenderness: There is generalized abdominal tenderness. There is no right CVA tenderness, left CVA tenderness, guarding or rebound.  Musculoskeletal:     Cervical back: Normal range of motion.     Comments: Moves all extremities equally and without difficulty.  Skin:    General: Skin is warm.     Capillary Refill: Capillary refill takes less than 2 seconds.  Neurological:     Mental Status: She is alert.     GCS: GCS eye subscore is 4. GCS verbal subscore is 5. GCS motor subscore is 6.     Comments: Speech is clear and goal oriented.  Psychiatric:        Mood and Affect: Mood normal.    ED Results / Procedures / Treatments   Labs (all labs ordered are listed, but only abnormal results are displayed) Labs Reviewed  CBC WITH DIFFERENTIAL/PLATELET - Abnormal; Notable for the following components:      Result Value   WBC 13.8 (*)    MCV 100.7 (*)    Neutro Abs 10.0 (*)    All other components within normal limits  COMPREHENSIVE METABOLIC PANEL - Abnormal; Notable for the following components:   Chloride 112 (*)    Glucose, Bld 135 (*)    Anion gap 4 (*)     All other components within normal limits  URINALYSIS, ROUTINE W REFLEX MICROSCOPIC - Abnormal; Notable for the following components:   Specific Gravity, Urine <1.005 (*)  Hgb urine dipstick LARGE (*)    All other components within normal limits  URINALYSIS, MICROSCOPIC (REFLEX) - Abnormal; Notable for the following components:   Bacteria, UA RARE (*)    All other components within normal limits  CULTURE, BLOOD (SINGLE)  LIPASE, BLOOD  LACTIC ACID, PLASMA  HCG, QUANTITATIVE, PREGNANCY    EKG None  Radiology CT Abdomen Pelvis W Contrast  Result Date: 01/09/2021 CLINICAL DATA:  Acute diffuse abdominal pain.  Nausea. EXAM: CT ABDOMEN AND PELVIS WITH CONTRAST TECHNIQUE: Multidetector CT imaging of the abdomen and pelvis was performed using the standard protocol following bolus administration of intravenous contrast. CONTRAST:  77m OMNIPAQUE IOHEXOL 350 MG/ML SOLN COMPARISON:  None. FINDINGS: Lower chest: Minor dependent atelectasis. Hepatobiliary: No focal liver abnormality is seen. No gallstones, gallbladder wall thickening, or biliary dilatation. Pancreas: Unremarkable. No pancreatic ductal dilatation or surrounding inflammatory changes. Spleen: Normal in size without focal abnormality. Adrenals/Urinary Tract: No adrenal nodule. Suspect extrarenal pelvis configuration of both kidneys with prominence of the renal pelvis but no significant calyceal dilatation. Homogeneous renal enhancement without perinephric edema. Unremarkable urinary bladder. No urolithiasis. Left pelvic calcifications are outside the course of the ureter and consistent with phleboliths. Stomach/Bowel: Stomach is within normal limits. Appendix appears normal. No evidence of bowel wall thickening, distention, or inflammatory changes. Small to moderate colonic stool burden. Vascular/Lymphatic: Normal caliber abdominal aorta. Patent portal vein. Prominent central mesenteric lymph nodes are not enlarged by size criteria no  enlarged lymph nodes in the abdomen or pelvis. Reproductive: IUD in the uterus. This appears appropriately positioned by CT. No dominant ovarian cyst or adnexal mass. Trace pelvic free fluid. Other: No upper abdominal ascites. Trace free fluid in the pelvis is likely physiologic. Tiny fat containing umbilical hernia. There is no free air or focal fluid collection. Musculoskeletal: There are no acute or suspicious osseous abnormalities. IMPRESSION: No acute abnormality or explanation for abdominal pain. Electronically Signed   By: MKeith RakeM.D.   On: 01/09/2021 00:28   DG Abd Acute W/Chest  Result Date: 01/09/2021 CLINICAL DATA:  Abdominal pain. EXAM: DG ABDOMEN ACUTE WITH 1 VIEW CHEST COMPARISON:  Immediately preceding CT. FINDINGS: The lungs are clear. The heart is normal in size. No pleural effusion or pneumothorax. Normal bowel gas pattern without obstruction. There is excreted IV contrast in the urinary bladder from prior CT. Small to moderate colonic stool burden. Left pelvic phleboliths. No acute osseous abnormalities are seen. IMPRESSION: Negative abdominal radiographs. No acute cardiopulmonary disease. Excreted IV contrast in the urinary bladder from abdominopelvic CT performed immediately prior. Electronically Signed   By: MKeith RakeM.D.   On: 01/09/2021 01:06    Procedures .Critical Care Performed by: MAbigail Butts PA-C Authorized by: MAbigail Butts PA-C   Critical care provider statement:    Critical care time (minutes):  45   Critical care time was exclusive of:  Separately billable procedures and treating other patients and teaching time   Critical care was necessary to treat or prevent imminent or life-threatening deterioration of the following conditions:  Circulatory failure   Critical care was time spent personally by me on the following activities:  Discussions with consultants, evaluation of patient's response to treatment, examination of patient,  ordering and performing treatments and interventions, ordering and review of laboratory studies, ordering and review of radiographic studies, pulse oximetry, re-evaluation of patient's condition, obtaining history from patient or surrogate and review of old charts   I assumed direction of critical care for this patient from another  provider in my specialty: no     Medications Ordered in ED Medications  sodium chloride 0.9 % bolus 1,000 mL (0 mLs Intravenous Stopped 01/09/21 0000)  iohexol (OMNIPAQUE) 350 MG/ML injection 80 mL (80 mLs Intravenous Contrast Given 01/09/21 0012)  oxyCODONE-acetaminophen (PERCOCET/ROXICET) 5-325 MG per tablet 2 tablet (2 tablets Oral Given 01/09/21 0153)    ED Course  I have reviewed the triage vital signs and the nursing notes.  Pertinent labs & imaging results that were available during my care of the patient were reviewed by me and considered in my medical decision making (see chart for details).    MDM Rules/Calculators/A&P                           Patient presents with 24 hours of abdominal pain.  No back pain or urinary symptoms.  Patient with full syncopal episode during initial history and physical.  Became pale and diaphoretic.  Blood pressure 71/50.  No loss of pulses however patient did become bradycardic.  IV started along with fluid bolus.  Patient placed on stretcher and taken to resuscitation bay.  Blood pressure improving.  Rectal temperature 100.1.  Lactic and blood cultures along with other labs and CT scan pending.  2:36 AM CT scan and blood work without acute abnormality.  Mild leukocytosis is noted however below patient's previous.  No elevation in lactic acid.  Patient is feeling significantly better.  Pain is improved with oral pain control.  No additional vomiting.  Abdomen soft and nontender on reevaluation.  She has been able to tolerate p.o. without difficulty.  Suspect initial episode of syncope was vasovagal in nature considering patient  became bradycardic as well.  No additional episodes here in the emergency department.  She remains without chest pain or shortness of breath.  No orthostasis after fluids given.  Orthostatic VS for the past 24 hrs:  BP- Lying Pulse- Lying BP- Sitting Pulse- Sitting BP- Standing at 0 minutes Pulse- Standing at 0 minutes  01/08/21 2345 125/76 80 136/57 88 112/81 80   Discussed findings with patient and close outpatient follow-up.  She reports she has an appointment with her primary care later today and will discuss all of these findings.  Also discussed reasons to return immediately to the emergency department.  Patient states understanding and is in agreement with the plan.   Final Clinical Impression(s) / ED Diagnoses Final diagnoses:  Generalized abdominal pain  Syncope, unspecified syncope type    Rx / DC Orders ED Discharge Orders          Ordered    ondansetron (ZOFRAN) 4 MG tablet  Every 8 hours PRN        01/09/21 0235    HYDROcodone-acetaminophen (NORCO/VICODIN) 5-325 MG tablet  Every 6 hours PRN        01/09/21 0235             Kelena Garrow, Jarrett Soho, PA-C 01/09/21 0239    Dorie Rank, MD 01/10/21 [phone]

## 2021-01-09 ENCOUNTER — Emergency Department (HOSPITAL_COMMUNITY): Payer: BC Managed Care – PPO

## 2021-01-09 DIAGNOSIS — I878 Other specified disorders of veins: Secondary | ICD-10-CM | POA: Diagnosis not present

## 2021-01-09 DIAGNOSIS — E559 Vitamin D deficiency, unspecified: Secondary | ICD-10-CM | POA: Diagnosis not present

## 2021-01-09 DIAGNOSIS — R109 Unspecified abdominal pain: Secondary | ICD-10-CM | POA: Diagnosis not present

## 2021-01-09 DIAGNOSIS — N938 Other specified abnormal uterine and vaginal bleeding: Secondary | ICD-10-CM | POA: Diagnosis not present

## 2021-01-09 DIAGNOSIS — Z09 Encounter for follow-up examination after completed treatment for conditions other than malignant neoplasm: Secondary | ICD-10-CM | POA: Diagnosis not present

## 2021-01-09 LAB — URINALYSIS, ROUTINE W REFLEX MICROSCOPIC
Bilirubin Urine: NEGATIVE
Glucose, UA: NEGATIVE mg/dL
Ketones, ur: NEGATIVE mg/dL
Leukocytes,Ua: NEGATIVE
Nitrite: NEGATIVE
Protein, ur: NEGATIVE mg/dL
Specific Gravity, Urine: <1.005 — ABNORMAL LOW (ref 1.005–1.030)
pH: 7 (ref 5.0–8.0)

## 2021-01-09 LAB — URINALYSIS, MICROSCOPIC (REFLEX)

## 2021-01-09 MED ORDER — MORPHINE SULFATE (PF) 4 MG/ML IV SOLN
4.0000 mg | Freq: Once | INTRAVENOUS | Status: DC
  Filled 2021-01-09: qty 1

## 2021-01-09 MED ORDER — HYDROCODONE-ACETAMINOPHEN 5-325 MG PO TABS: 1.0000 | ORAL_TABLET | Freq: Four times a day (QID) | ORAL | 0 refills | Status: DC | PRN

## 2021-01-09 MED ORDER — OXYCODONE-ACETAMINOPHEN 5-325 MG PO TABS
2.0000 | ORAL_TABLET | Freq: Once | ORAL | Status: AC
  Administered 2021-01-09: 2 via ORAL
  Filled 2021-01-09: qty 2

## 2021-01-09 MED ORDER — ONDANSETRON HCL 4 MG PO TABS: 4.0000 mg | ORAL_TABLET | Freq: Three times a day (TID) | ORAL | 0 refills | Status: DC | PRN

## 2021-01-09 NOTE — Discharge Instructions (Addendum)
1. Medications: zofran, vicodin, usual home medications 2. Treatment: rest, drink plenty of fluids, advance diet slowly 3. Follow Up: Please followup with your primary doctor in 2 days for discussion of your diagnoses and further evaluation after today's visit; if you do not have a primary care doctor use the resource guide provided to find one; Please return to the ER for persistent vomiting, high fevers or worsening symptoms

## 2021-01-09 NOTE — ED Notes (Signed)
Patient given water for fluid challenge.

## 2021-01-12 DIAGNOSIS — N938 Other specified abnormal uterine and vaginal bleeding: Secondary | ICD-10-CM | POA: Diagnosis not present

## 2021-01-12 DIAGNOSIS — R5383 Other fatigue: Secondary | ICD-10-CM | POA: Diagnosis not present

## 2021-01-14 DIAGNOSIS — F3176 Bipolar disorder, in full remission, most recent episode depressed: Secondary | ICD-10-CM | POA: Diagnosis not present

## 2021-01-14 LAB — CULTURE, BLOOD (SINGLE)
Culture: NO GROWTH
Special Requests: ADEQUATE

## 2021-01-21 DIAGNOSIS — F3176 Bipolar disorder, in full remission, most recent episode depressed: Secondary | ICD-10-CM | POA: Diagnosis not present

## 2021-01-29 DIAGNOSIS — F3176 Bipolar disorder, in full remission, most recent episode depressed: Secondary | ICD-10-CM | POA: Diagnosis not present

## 2021-01-30 DIAGNOSIS — Z9109 Other allergy status, other than to drugs and biological substances: Secondary | ICD-10-CM | POA: Diagnosis not present

## 2021-01-30 DIAGNOSIS — J069 Acute upper respiratory infection, unspecified: Secondary | ICD-10-CM | POA: Diagnosis not present

## 2021-01-30 DIAGNOSIS — J329 Chronic sinusitis, unspecified: Secondary | ICD-10-CM | POA: Diagnosis not present

## 2021-01-30 DIAGNOSIS — J45909 Unspecified asthma, uncomplicated: Secondary | ICD-10-CM | POA: Diagnosis not present

## 2021-02-02 ENCOUNTER — Ambulatory Visit
Admission: RE | Admit: 2021-02-02 | Discharge: 2021-02-02 | Disposition: A | Discharge status: Home / Self Care | Payer: BC Managed Care – PPO | Source: Ambulatory Visit | Attending: Family Medicine | Admitting: Family Medicine

## 2021-02-02 ENCOUNTER — Other Ambulatory Visit: Payer: Self-pay | Admitting: Family Medicine

## 2021-02-02 DIAGNOSIS — R053 Chronic cough: Secondary | ICD-10-CM | POA: Diagnosis not present

## 2021-02-02 DIAGNOSIS — R062 Wheezing: Secondary | ICD-10-CM

## 2021-02-03 DIAGNOSIS — J45909 Unspecified asthma, uncomplicated: Secondary | ICD-10-CM | POA: Diagnosis not present

## 2021-02-03 DIAGNOSIS — Z8659 Personal history of other mental and behavioral disorders: Secondary | ICD-10-CM | POA: Diagnosis not present

## 2021-02-03 DIAGNOSIS — G4730 Sleep apnea, unspecified: Secondary | ICD-10-CM | POA: Diagnosis not present

## 2021-02-03 DIAGNOSIS — F319 Bipolar disorder, unspecified: Secondary | ICD-10-CM | POA: Diagnosis not present

## 2021-02-03 DIAGNOSIS — F649 Gender identity disorder, unspecified: Secondary | ICD-10-CM | POA: Diagnosis not present

## 2021-02-03 DIAGNOSIS — J069 Acute upper respiratory infection, unspecified: Secondary | ICD-10-CM | POA: Diagnosis not present

## 2021-02-03 DIAGNOSIS — B9689 Other specified bacterial agents as the cause of diseases classified elsewhere: Secondary | ICD-10-CM | POA: Diagnosis not present

## 2021-02-03 DIAGNOSIS — J329 Chronic sinusitis, unspecified: Secondary | ICD-10-CM | POA: Diagnosis not present

## 2021-02-05 DIAGNOSIS — F3176 Bipolar disorder, in full remission, most recent episode depressed: Secondary | ICD-10-CM | POA: Diagnosis not present

## 2021-02-08 ENCOUNTER — Emergency Department (HOSPITAL_COMMUNITY): Payer: BC Managed Care – PPO

## 2021-02-08 ENCOUNTER — Encounter (HOSPITAL_COMMUNITY): Payer: Self-pay | Admitting: Emergency Medicine

## 2021-02-08 ENCOUNTER — Emergency Department (HOSPITAL_COMMUNITY)
Admission: EM | Admit: 2021-02-08 | Discharge: 2021-02-08 | Disposition: A | Discharge status: Home / Self Care | Payer: BC Managed Care – PPO | Attending: Emergency Medicine | Admitting: Emergency Medicine

## 2021-02-08 DIAGNOSIS — W228XXA Striking against or struck by other objects, initial encounter: Secondary | ICD-10-CM | POA: Diagnosis not present

## 2021-02-08 DIAGNOSIS — S0990XA Unspecified injury of head, initial encounter: Secondary | ICD-10-CM | POA: Diagnosis not present

## 2021-02-08 DIAGNOSIS — R42 Dizziness and giddiness: Secondary | ICD-10-CM | POA: Diagnosis not present

## 2021-02-08 DIAGNOSIS — Z743 Need for continuous supervision: Secondary | ICD-10-CM | POA: Diagnosis not present

## 2021-02-08 DIAGNOSIS — R404 Transient alteration of awareness: Secondary | ICD-10-CM | POA: Diagnosis not present

## 2021-02-08 NOTE — ED Triage Notes (Addendum)
Pt c/o dizziness, nausea, and ringing in both ears after hitting her head around 8 pm tonight. Denies LOC. VSS.

## 2021-02-08 NOTE — Discharge Instructions (Addendum)
You were seen today for head injury.  This is likely minor in nature.  You may have sustained a mild concussion; however, your CT scan is negative.  Follow-up with your primary doctor.  If he has sustained symptoms, you need recheck.  Make sure that you are decreasing stimulus and resting over the next 24 to 48 hours.

## 2021-02-08 NOTE — ED Provider Notes (Signed)
[facility] DEPT Provider Note   CSN: [id number] Arrival date & time: 02/08/21  0042     History Chief Complaint  Patient presents with   Dizziness    Katie [name] is a 35 y.o. female.  HPI     This is a 35 year old female with a history of anxiety, bipolar who presents with headache and "not feeling right."  Patient states that she hit her head forcefully on a beam in her chicken coop.  This happened at 8 PM last night.  She went to bed and woke up around midnight and "I just did not feel right."  She reports dizziness, headache, ringing in her bilateral ears, and nausea.  She currently rates her headache a 2 out of 10.  She has not taken anything for her symptoms.  She is concerned she may have sustained a concussion.  Denies any vision changes.  Denies loss of consciousness.  Not on any anticoagulants.  Past Medical History:  Diagnosis Date   Anxiety    Bipolar 1 disorder ([facility])    Depression    Fibroid    Genital herpes     Patient Active Problem List   Diagnosis Date Noted   Indication for care in labor or delivery 01/19/2017   Preterm labor 01/10/2017    Past Surgical History:  Procedure Laterality Date   NO PAST SURGERIES       OB History     Gravida  1   Para  1   Term  0   Preterm  1   AB  0   Living  1      SAB  0   IAB  0   Ectopic  0   Multiple  0   Live Births  1           Family History  Problem Relation Age of Onset   Arrhythmia Mother    Hypertension Father    Arrhythmia Maternal Grandmother    Diabetes Paternal Grandmother    Hypertension Paternal Grandmother    Diabetes Paternal Grandfather    Hypertension Paternal Grandfather     Social History   Tobacco Use   Smoking status: Never   Smokeless tobacco: Never  Substance Use Topics   Alcohol use: Yes    Alcohol/week: 0.0 standard drinks    Comment: SOCIAL     Home Medications Prior to Admission medications   Medication  Sig Start Date End Date Taking? Authorizing Provider  ALPRAZolam (XANAX) 0.25 MG tablet Take 0.25 mg by mouth daily.    [provider]  atomoxetine (STRATTERA) 40 MG capsule Take 40 mg by mouth daily. Patient not taking: Reported on 01/09/2021    [provider]  Cholecalciferol (VITAMIN D3) 1.25 MG (50000 UT) CAPS Take by mouth.    [provider]  HYDROcodone-acetaminophen (NORCO/VICODIN) 5-325 MG tablet Take 1 tablet by mouth every 6 (six) hours as needed. 01/09/21   Muthersbaugh, Jarrett Soho, PA-C  lamoTRIgine (LAMICTAL) 100 MG tablet Take 200 mg by mouth daily.    [provider]  lithium carbonate 300 MG capsule Take 900 mg by mouth daily.    [provider]  modafinil (PROVIGIL) 200 MG tablet Take 200 mg by mouth daily. Patient not taking: Reported on 01/09/2021    [provider]  ondansetron (ZOFRAN) 4 MG tablet Take 1 tablet (4 mg total) by mouth every 8 (eight) hours as needed for nausea or vomiting. 01/09/21   Muthersbaugh, Jarrett Soho, PA-C  Prenatal Vit-Fe Fumarate-FA (PRENATAL MULTIVITAMIN) TABS tablet Take 1 tablet by mouth at bedtime.    [provider]  QUEtiapine (SEROQUEL) 25 MG tablet Take 25 mg by mouth at bedtime. 01/06/21   [provider]  topiramate (TOPAMAX) 50 MG tablet Take 50 mg by mouth 2 (two) times daily.    [provider]  valACYclovir (VALTREX) 500 MG tablet Take 500 mg by mouth daily.    [provider]    Allergies    Patient has no known allergies.  Review of Systems   Review of Systems  Constitutional:  Negative for fever.  Eyes:  Positive for photophobia. Negative for visual disturbance.  Respiratory:  Negative for shortness of breath.   Cardiovascular:  Negative for chest pain.  Gastrointestinal:  Negative for abdominal pain.  Neurological:  Positive for dizziness and headaches. Negative for syncope.  All other systems reviewed and are negative.  Physical Exam Updated Vital  Signs BP 129/87   Pulse 95   Temp 98.3 F (36.8 C)   Resp 16   LMP 02/01/2021 (Exact Date)   SpO2 95%   Physical Exam Vitals and nursing note reviewed.  Constitutional:      Appearance: She is well-developed. She is not ill-appearing.  HENT:     Head: Normocephalic and atraumatic.     Nose: Nose normal.     Mouth/Throat:     Mouth: Mucous membranes are moist.  Eyes:     Pupils: Pupils are equal, round, and reactive to light.  Cardiovascular:     Rate and Rhythm: Normal rate and regular rhythm.     Heart sounds: Normal heart sounds.  Pulmonary:     Effort: Pulmonary effort is normal. No respiratory distress.     Breath sounds: Rales present. No wheezing.  Abdominal:     Palpations: Abdomen is soft.     Tenderness: There is no abdominal tenderness.  Musculoskeletal:     Cervical back: Neck supple.     Right lower leg: No edema.     Left lower leg: No edema.  Skin:    General: Skin is warm and dry.  Neurological:     Mental Status: She is alert and oriented to person, place, and time.     Comments: Cranial nerves II through XII intact, 5 5 strength in all 4 extremities, no dysmetria to finger-nose-finger, no drift  Psychiatric:        Mood and Affect: Mood normal.    ED Results / Procedures / Treatments   Labs (all labs ordered are listed, but only abnormal results are displayed) Labs Reviewed - No data to display  EKG None  Radiology CT Head Wo Contrast  Result Date: 02/08/2021 CLINICAL DATA:  35 year old female with history of facial trauma. Dizziness. Nausea. EXAM: CT HEAD WITHOUT CONTRAST TECHNIQUE: Contiguous axial images were obtained from the base of the skull through the vertex without intravenous contrast. COMPARISON:  No priors. FINDINGS: Brain: No evidence of acute infarction, hemorrhage, hydrocephalus, extra-axial collection or mass lesion/mass effect. Vascular: No hyperdense vessel or unexpected calcification. Skull: Normal. Negative for fracture or  focal lesion. Sinuses/Orbits: No acute finding. Mild multifocal mucosal thickening in the paranasal sinuses, predominantly in the ethmoid sinuses bilaterally (left greater than right), left frontal sinus and sphenoid sinus posteriorly. Other: None. IMPRESSION: 1. No acute intracranial abnormalities. The appearance of the brain is normal. 2. Mild paranasal sinus disease, as above, without evidence to suggest an acute sinusitis. Electronically Signed   By: Quillian Quince  Entrikin M.D.   On: 02/08/2021 06:30    Procedures Procedures   Medications Ordered in ED Medications - No data to display  ED Course  I have reviewed the triage vital signs and the nursing notes.  Pertinent labs & imaging results that were available during my care of the patient were reviewed by me and considered in my medical decision making (see chart for details).    MDM Rules/Calculators/A&P                           Patient presents with concerns for head injury.  She is nontoxic and vital signs are reassuring.  She is neurologically intact with a normal neurologic exam.  Suspect minor head injury versus mild concussion given her symptoms.  CT scan obtained from triage reviewed and shows no evidence of acute intracranial injury.  This is reassuring.  Patient was given concussion precautions.  She is otherwise low risk.  After history, exam, and medical workup I feel the patient has been appropriately medically screened and is safe for discharge home. Pertinent diagnoses were discussed with the patient. Patient was given return precautions.  Final Clinical Impression(s) / ED Diagnoses Final diagnoses:  Minor head injury, initial encounter    Rx / DC Orders ED Discharge Orders     None        Kayonna Lawniczak, Barbette Hair, MD 02/08/21 [phone]

## 2021-02-10 DIAGNOSIS — Z09 Encounter for follow-up examination after completed treatment for conditions other than malignant neoplasm: Secondary | ICD-10-CM | POA: Diagnosis not present

## 2021-02-10 DIAGNOSIS — Z87828 Personal history of other (healed) physical injury and trauma: Secondary | ICD-10-CM | POA: Diagnosis not present

## 2021-02-10 DIAGNOSIS — J45901 Unspecified asthma with (acute) exacerbation: Secondary | ICD-10-CM | POA: Diagnosis not present

## 2021-02-10 DIAGNOSIS — J4540 Moderate persistent asthma, uncomplicated: Secondary | ICD-10-CM | POA: Diagnosis not present

## 2021-02-11 DIAGNOSIS — F3176 Bipolar disorder, in full remission, most recent episode depressed: Secondary | ICD-10-CM | POA: Diagnosis not present

## 2021-02-18 DIAGNOSIS — N939 Abnormal uterine and vaginal bleeding, unspecified: Secondary | ICD-10-CM | POA: Diagnosis not present

## 2021-02-18 DIAGNOSIS — R5383 Other fatigue: Secondary | ICD-10-CM | POA: Diagnosis not present

## 2021-02-19 DIAGNOSIS — J370 Chronic laryngitis: Secondary | ICD-10-CM | POA: Diagnosis not present

## 2021-02-19 DIAGNOSIS — F411 Generalized anxiety disorder: Secondary | ICD-10-CM | POA: Diagnosis not present

## 2021-02-19 DIAGNOSIS — F319 Bipolar disorder, unspecified: Secondary | ICD-10-CM | POA: Diagnosis not present

## 2021-02-19 DIAGNOSIS — J309 Allergic rhinitis, unspecified: Secondary | ICD-10-CM | POA: Diagnosis not present

## 2021-02-25 DIAGNOSIS — F3176 Bipolar disorder, in full remission, most recent episode depressed: Secondary | ICD-10-CM | POA: Diagnosis not present

## 2021-03-13 DIAGNOSIS — F3176 Bipolar disorder, in full remission, most recent episode depressed: Secondary | ICD-10-CM | POA: Diagnosis not present

## 2021-03-18 ENCOUNTER — Encounter: Payer: Self-pay | Admitting: Internal Medicine

## 2021-03-18 DIAGNOSIS — J300 Vasomotor rhinitis: Secondary | ICD-10-CM | POA: Diagnosis not present

## 2021-03-18 DIAGNOSIS — R059 Cough, unspecified: Secondary | ICD-10-CM | POA: Diagnosis not present

## 2021-03-18 DIAGNOSIS — J309 Allergic rhinitis, unspecified: Secondary | ICD-10-CM | POA: Diagnosis not present

## 2021-03-18 DIAGNOSIS — F3176 Bipolar disorder, in full remission, most recent episode depressed: Secondary | ICD-10-CM | POA: Diagnosis not present

## 2021-03-19 DIAGNOSIS — F411 Generalized anxiety disorder: Secondary | ICD-10-CM | POA: Diagnosis not present

## 2021-03-25 DIAGNOSIS — F3176 Bipolar disorder, in full remission, most recent episode depressed: Secondary | ICD-10-CM | POA: Diagnosis not present

## 2021-04-01 DIAGNOSIS — F411 Generalized anxiety disorder: Secondary | ICD-10-CM | POA: Diagnosis not present

## 2021-04-01 DIAGNOSIS — G4700 Insomnia, unspecified: Secondary | ICD-10-CM | POA: Diagnosis not present

## 2021-04-01 DIAGNOSIS — J309 Allergic rhinitis, unspecified: Secondary | ICD-10-CM | POA: Diagnosis not present

## 2021-04-01 DIAGNOSIS — F331 Major depressive disorder, recurrent, moderate: Secondary | ICD-10-CM | POA: Diagnosis not present

## 2021-04-02 DIAGNOSIS — F3176 Bipolar disorder, in full remission, most recent episode depressed: Secondary | ICD-10-CM | POA: Diagnosis not present

## 2021-04-03 DIAGNOSIS — Z0000 Encounter for general adult medical examination without abnormal findings: Secondary | ICD-10-CM | POA: Diagnosis not present

## 2021-04-07 DIAGNOSIS — Z0000 Encounter for general adult medical examination without abnormal findings: Secondary | ICD-10-CM | POA: Diagnosis not present

## 2021-04-07 DIAGNOSIS — Z23 Encounter for immunization: Secondary | ICD-10-CM | POA: Diagnosis not present

## 2021-04-09 DIAGNOSIS — F3176 Bipolar disorder, in full remission, most recent episode depressed: Secondary | ICD-10-CM | POA: Diagnosis not present

## 2021-04-13 DIAGNOSIS — Z8659 Personal history of other mental and behavioral disorders: Secondary | ICD-10-CM | POA: Diagnosis not present

## 2021-04-13 DIAGNOSIS — F319 Bipolar disorder, unspecified: Secondary | ICD-10-CM | POA: Diagnosis not present

## 2021-04-13 DIAGNOSIS — F649 Gender identity disorder, unspecified: Secondary | ICD-10-CM | POA: Diagnosis not present

## 2021-04-13 DIAGNOSIS — G4730 Sleep apnea, unspecified: Secondary | ICD-10-CM | POA: Diagnosis not present

## 2021-04-17 DIAGNOSIS — F3176 Bipolar disorder, in full remission, most recent episode depressed: Secondary | ICD-10-CM | POA: Diagnosis not present

## 2021-04-22 DIAGNOSIS — F3176 Bipolar disorder, in full remission, most recent episode depressed: Secondary | ICD-10-CM | POA: Diagnosis not present

## 2021-04-29 DIAGNOSIS — F3176 Bipolar disorder, in full remission, most recent episode depressed: Secondary | ICD-10-CM | POA: Diagnosis not present

## 2021-05-05 DIAGNOSIS — Z23 Encounter for immunization: Secondary | ICD-10-CM | POA: Diagnosis not present

## 2021-05-06 ENCOUNTER — Telehealth: Payer: Self-pay | Admitting: Neurology

## 2021-05-06 NOTE — Telephone Encounter (Signed)
Since she has not been seen in this office in a year, please schedule a follow-up appointment, she can see one of our nurse practitioners to discuss starting AutoPap therapy.  Please ask her to bring in any records for the appointment from her dentist who treated her for sleep apnea with an oral appliance.

## 2021-05-06 NOTE — Telephone Encounter (Signed)
Pt called states her dental device for sleep apnea is not helping, and wanting to do the APAP instead. Pt requesting a call back.

## 2021-05-06 NOTE — Telephone Encounter (Signed)
Scheduled appt with Janett Billow on 06/30/21 at 7:45a to discuss getting a APAP. Per Dr. Rexene Alberts informed pt to bring any records from her dentist who treated her for the sleep apnea with the oral appliance so this can be reviewed at her appointment here. Pt verbalized understand

## 2021-05-06 NOTE — Telephone Encounter (Signed)
LVM for pt to call back to schedule f/u appt

## 2021-05-07 DIAGNOSIS — J45991 Cough variant asthma: Secondary | ICD-10-CM | POA: Insufficient documentation

## 2021-05-07 DIAGNOSIS — J45909 Unspecified asthma, uncomplicated: Secondary | ICD-10-CM | POA: Insufficient documentation

## 2021-05-07 DIAGNOSIS — F3176 Bipolar disorder, in full remission, most recent episode depressed: Secondary | ICD-10-CM | POA: Diagnosis not present

## 2021-05-07 DIAGNOSIS — N809 Endometriosis, unspecified: Secondary | ICD-10-CM | POA: Diagnosis not present

## 2021-05-07 DIAGNOSIS — R5383 Other fatigue: Secondary | ICD-10-CM | POA: Diagnosis not present

## 2021-05-07 DIAGNOSIS — N939 Abnormal uterine and vaginal bleeding, unspecified: Secondary | ICD-10-CM | POA: Diagnosis not present

## 2021-05-12 DIAGNOSIS — F319 Bipolar disorder, unspecified: Secondary | ICD-10-CM | POA: Diagnosis not present

## 2021-05-12 DIAGNOSIS — Z8659 Personal history of other mental and behavioral disorders: Secondary | ICD-10-CM | POA: Diagnosis not present

## 2021-05-12 DIAGNOSIS — G4730 Sleep apnea, unspecified: Secondary | ICD-10-CM | POA: Diagnosis not present

## 2021-05-12 DIAGNOSIS — F649 Gender identity disorder, unspecified: Secondary | ICD-10-CM | POA: Diagnosis not present

## 2021-05-14 DIAGNOSIS — F3176 Bipolar disorder, in full remission, most recent episode depressed: Secondary | ICD-10-CM | POA: Diagnosis not present

## 2021-05-21 DIAGNOSIS — D72829 Elevated white blood cell count, unspecified: Secondary | ICD-10-CM | POA: Diagnosis not present

## 2021-05-21 DIAGNOSIS — F3176 Bipolar disorder, in full remission, most recent episode depressed: Secondary | ICD-10-CM | POA: Diagnosis not present

## 2021-05-26 DIAGNOSIS — B9689 Other specified bacterial agents as the cause of diseases classified elsewhere: Secondary | ICD-10-CM | POA: Diagnosis not present

## 2021-05-26 DIAGNOSIS — J329 Chronic sinusitis, unspecified: Secondary | ICD-10-CM | POA: Diagnosis not present

## 2021-05-26 DIAGNOSIS — J069 Acute upper respiratory infection, unspecified: Secondary | ICD-10-CM | POA: Diagnosis not present

## 2021-05-27 DIAGNOSIS — F3176 Bipolar disorder, in full remission, most recent episode depressed: Secondary | ICD-10-CM | POA: Diagnosis not present

## 2021-06-02 DIAGNOSIS — U099 Post covid-19 condition, unspecified: Secondary | ICD-10-CM | POA: Diagnosis not present

## 2021-06-02 DIAGNOSIS — J45909 Unspecified asthma, uncomplicated: Secondary | ICD-10-CM | POA: Diagnosis not present

## 2021-06-02 DIAGNOSIS — J069 Acute upper respiratory infection, unspecified: Secondary | ICD-10-CM | POA: Diagnosis not present

## 2021-06-02 DIAGNOSIS — U071 COVID-19: Secondary | ICD-10-CM | POA: Diagnosis not present

## 2021-06-09 DIAGNOSIS — Z8659 Personal history of other mental and behavioral disorders: Secondary | ICD-10-CM | POA: Diagnosis not present

## 2021-06-09 DIAGNOSIS — F319 Bipolar disorder, unspecified: Secondary | ICD-10-CM | POA: Diagnosis not present

## 2021-06-09 DIAGNOSIS — G4730 Sleep apnea, unspecified: Secondary | ICD-10-CM | POA: Diagnosis not present

## 2021-06-09 DIAGNOSIS — F649 Gender identity disorder, unspecified: Secondary | ICD-10-CM | POA: Diagnosis not present

## 2021-06-12 DIAGNOSIS — F3176 Bipolar disorder, in full remission, most recent episode depressed: Secondary | ICD-10-CM | POA: Diagnosis not present

## 2021-06-13 DIAGNOSIS — R5383 Other fatigue: Secondary | ICD-10-CM | POA: Diagnosis not present

## 2021-06-13 DIAGNOSIS — U071 COVID-19: Secondary | ICD-10-CM | POA: Diagnosis not present

## 2021-06-16 DIAGNOSIS — F3176 Bipolar disorder, in full remission, most recent episode depressed: Secondary | ICD-10-CM | POA: Diagnosis not present

## 2021-06-24 DIAGNOSIS — F319 Bipolar disorder, unspecified: Secondary | ICD-10-CM | POA: Diagnosis not present

## 2021-06-25 DIAGNOSIS — M2550 Pain in unspecified joint: Secondary | ICD-10-CM | POA: Diagnosis not present

## 2021-06-25 DIAGNOSIS — R5383 Other fatigue: Secondary | ICD-10-CM | POA: Diagnosis not present

## 2021-06-25 DIAGNOSIS — Z91018 Allergy to other foods: Secondary | ICD-10-CM | POA: Diagnosis not present

## 2021-06-25 DIAGNOSIS — F319 Bipolar disorder, unspecified: Secondary | ICD-10-CM | POA: Diagnosis not present

## 2021-06-26 DIAGNOSIS — F3176 Bipolar disorder, in full remission, most recent episode depressed: Secondary | ICD-10-CM | POA: Diagnosis not present

## 2021-06-30 ENCOUNTER — Other Ambulatory Visit: Payer: Self-pay

## 2021-06-30 ENCOUNTER — Ambulatory Visit: Payer: BC Managed Care – PPO | Admitting: Adult Health

## 2021-06-30 ENCOUNTER — Encounter: Payer: Self-pay | Admitting: Adult Health

## 2021-06-30 VITALS — BP 116/77 | HR 76 | Ht 64.0 in | Wt 137.0 lb

## 2021-06-30 DIAGNOSIS — G4733 Obstructive sleep apnea (adult) (pediatric): Secondary | ICD-10-CM

## 2021-06-30 NOTE — Progress Notes (Signed)
[facility] [address] [place]. [place] [address] [phone]       OFFICE FOLLOW UP NOTE  Ms. Katie [name] Date of Birth:  July 13, 1985 Medical Record Number:  [id number]   Reason for visit: sleep apnea follow up    SUBJECTIVE:   CHIEF COMPLAINT:  Chief Complaint  Patient presents with   Daytime sleepiness    Rm 3, discuss getting APAP    HPI:   Update 06/30/2021 JM: 36 year old female who underwent HST in 05/2020 which showed mild or borderline OSA with AHI 6.3/h.  He chose to be treated with a dental device but per patient, Dr. Ron Parker had difficulty fitting him with an oral device due to his oral anatomy. He continues to struggle with daytime fatigue although does mention he had COVID beginning of this month and still struggling with fatigue post COVID.  Fatigue severity scale 58/63 and Epworth Sleepiness Scale 14/24. Of note, he has been working on weight loss with approx 24 lb weight loss since prior visit.  He does remain on multiple psychiatric medications including Xanax, lamotrigine, lithium, Seroquel, topiramate, and Adderall for hx of bipolar disorder, OCD and ADD.     History provided for reference purposes only Initial consult visit 05/07/2020 Dr. Rexene Alberts:  [name] is a 36 year old right-handed woman with an underlying medical history of depression, anxiety, ADD, fibroid disease, and overweight state, who reports snoring and excessive daytime somnolence. I reviewed your office records, including note from 04/01/20. Her Epworth sleepiness score is 6/24, fatigue severity score is 42/63. Of note, she is on multiple medications including several psychotropic medications, she is followed by psychiatry.  She is currently on Xanax, Strattera, Lamictal, Provigil, lithium, topiramate.  She is no longer on Abilify.  She also has a therapist.  She reports a diagnosis of bipolar disease, OCD, ADD. She lives with her husband and 1-year-old son.  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  She is a non-smoker and does not currently drink any alcohol, drinks caffeine in the form of coffee, about 2 cups/day on average.  She reports a several year history of difficulty initiating and maintaining sleep, nonrestorative sleep, multiple nighttime awakenings.  She has some snoring.  She reports that her father has sleep apnea and has a CPAP machine.  For sleep, she has tried lorazepam, Lunesta, trazodone, Sonata, and melatonin.  Melatonin caused nightmares.  She is currently taking Xanax 0.25 mg each bedtime.  Bedtime is generally around 9 PM and rise time between 6:30 AM and 6:45 AM.  She does not have a TV in the bedroom.  Her 26-year-old sleeps fairly well in his own bedroom.  They have 1 cat in the household.  She denies recurrent morning headaches.     ROS:   14 system review of systems performed and negative with exception of fatigue  PMH:  Past Medical History:  Diagnosis Date   Anxiety    Bipolar 1 disorder ([facility])    Depression    Excessive daytime sleepiness    Fibroid    Genital herpes     PSH:  Past Surgical History:  Procedure Laterality Date   NO PAST SURGERIES      Social History:  Social History   Socioeconomic History   Marital status: Married    Spouse name: Not on file   Number of children: 1   Years of education: Not on file   Highest education level: Not on file  Occupational History  Not on file  Tobacco Use   Smoking status: Never   Smokeless tobacco: Never  Substance and Sexual Activity   Alcohol use: Not Currently   Drug use: Never   Sexual activity: Yes    Birth control/protection: None    Comment: 1ST INTERCOURSE- 31, PARTNERS- 2   Other Topics Concern   Not on file  Social History Narrative   Lives with family   Social Determinants of Health   Financial Resource Strain: Not on file  Food Insecurity: Not on file  Transportation Needs: Not on file  Physical Activity: Not on file   Stress: Not on file  Social Connections: Not on file  Intimate Partner Violence: Not on file    Family History:  Family History  Problem Relation Age of Onset   Arrhythmia Mother    Hypertension Father    Arrhythmia Maternal Grandmother    Diabetes Paternal Grandmother    Hypertension Paternal Grandmother    Diabetes Paternal Grandfather    Hypertension Paternal Grandfather     Medications:   Current Outpatient Medications on File Prior to Visit  Medication Sig Dispense Refill   acyclovir (ZOVIRAX) 400 MG tablet Take 400 mg by mouth daily.     albuterol (VENTOLIN HFA) 108 (90 Base) MCG/ACT inhaler Inhale 2 puffs into the lungs every 4 (four) hours as needed.     ALPRAZolam (XANAX) 0.25 MG tablet Take 0.25 mg by mouth daily.     amphetamine-dextroamphetamine (ADDERALL XR) 10 MG 24 hr capsule Take 10 mg by mouth daily.     Cholecalciferol (VITAMIN D3) 1.25 MG (50000 UT) CAPS Take by mouth.     lamoTRIgine (LAMICTAL) 100 MG tablet Take 200 mg by mouth daily.     levonorgestrel (MIRENA, 52 MG,) 20 MCG/DAY IUD Mirena 21 mcg/24 hours (8 yrs) 52 mg intrauterine device     lithium carbonate 300 MG capsule Take 900 mg by mouth daily.     loratadine (CLARITIN) 10 MG tablet 10 mg daily.     ORILISSA 150 MG TABS Take 1 tablet by mouth daily.     Prenatal Vit-Fe Fumarate-FA (PRENATAL MULTIVITAMIN) TABS tablet Take 1 tablet by mouth at bedtime.     QUEtiapine (SEROQUEL) 25 MG tablet Take 25 mg by mouth at bedtime.     QVAR REDIHALER 40 MCG/ACT inhaler SMARTSIG:1-2 Puff(s) Via Inhaler Twice Daily     topiramate (TOPAMAX) 50 MG tablet Take 50 mg by mouth 2 (two) times daily.     valACYclovir (VALTREX) 500 MG tablet Take 500 mg by mouth daily.     atomoxetine (STRATTERA) 40 MG capsule Take 40 mg by mouth daily. (Patient not taking: Reported on 01/09/2021)     modafinil (PROVIGIL) 200 MG tablet Take 200 mg by mouth daily. (Patient not taking: Reported on 01/09/2021)     No current  facility-administered medications on file prior to visit.    Allergies:   Allergies  Allergen Reactions   Nitrofurantoin Nausea And Vomiting and Other (See Comments)    Other reaction(s): Abdominal Pain, GI Intolerance      OBJECTIVE:  Physical Exam  Vitals:   06/30/21 0745  BP: 116/77  Pulse: 76  Weight: 137 lb (62.1 kg)  Height: 5\' 4"  (1.626 m)   Body mass index is 23.52 kg/m. No results found.  General: well developed, well nourished, very pleasant middle-age female, seated, in no evident distress Head: head normocephalic and atraumatic.   Neck: supple with no carotid or supraclavicular bruits Cardiovascular: regular  rate and rhythm, no murmurs Musculoskeletal: no deformity Skin:  no rash/petichiae Vascular:  Normal pulses all extremities   Neurologic Exam Mental Status: Awake and fully alert. Oriented to place and time. Recent and remote memory intact. Attention span, concentration and fund of knowledge appropriate. Mood and affect appropriate.  Cranial Nerves: Pupils equal, briskly reactive to light. Extraocular movements full without nystagmus. Visual fields full to confrontation. Hearing intact. Facial sensation intact. Face, tongue, palate moves normally and symmetrically.  Motor: Normal bulk and tone. Normal strength in all tested extremity muscles Sensory.: intact to touch , pinprick , position and vibratory sensation.  Coordination: Rapid alternating movements normal in all extremities. Finger-to-nose and heel-to-shin performed accurately bilaterally. Gait and Station: Arises from chair without difficulty. Stance is normal. Gait demonstrates normal stride length and balance without use of AD. Tandem walk and heel toe without difficulty.  Reflexes: 1+ and symmetric. Toes downgoing.         ASSESSMENT: Katie [name] is a 36 y.o. year old adult with diagnosis of mild OSA after completion of HST in 05/2020 initially attempted to be treated with a dental device  but now wishing to pursue CPAP treatment.      PLAN:  Mild OSA: per insurance requirements and in setting of weight loss, will repeat HST. If apnea remains which requires treatment, will proceed with CPAP. He will follow up 30-90 days after initiating therapy.     CC:  [facility] provider: Dr. Rexene Alberts PCP: Fanny Bien, MD    I spent 26 minutes of face-to-face and non-face-to-face time with patient.  This included previsit chart review, lab review, study review, order entry, electronic health record documentation, patient education regarding diagnosis of sleep apnea and pursuing use of CPAP and answered all other questions to patient's satisfaction   Frann Rider, [facility]  [facility] [address] [place], [place] [address]  Phone [phone] Fax [phone] Note: This document was prepared with digital dictation and possible smart phrase technology. Any transcriptional errors that result from this process are unintentional.

## 2021-07-02 ENCOUNTER — Encounter: Payer: Self-pay | Admitting: Internal Medicine

## 2021-07-02 ENCOUNTER — Ambulatory Visit: Payer: BC Managed Care – PPO | Admitting: Internal Medicine

## 2021-07-02 ENCOUNTER — Other Ambulatory Visit: Payer: Self-pay

## 2021-07-02 VITALS — BP 118/68 | HR 82 | Temp 98.4°F | Ht 64.0 in | Wt 135.4 lb

## 2021-07-02 DIAGNOSIS — R053 Chronic cough: Secondary | ICD-10-CM | POA: Diagnosis not present

## 2021-07-02 DIAGNOSIS — J387 Other diseases of larynx: Secondary | ICD-10-CM | POA: Diagnosis not present

## 2021-07-02 NOTE — Patient Instructions (Signed)
ICD-10-CM   ?1. Chronic cough  R05.3   ?  ?2. Irritable larynx syndrome  J38.7   ?  ? ? ?Cough dominant feature  is cylical cough/LPR cough or cough neuropathy but need to rule inciting causes if any. MAny times we do not know what caused it ? ?Plan ? - do HRCT supine and prone ? - do CT sinus without contrast ? - do full PFT ? - do Feno test ?  - sign release to get your allergist reocrds ?- get clearance from psychiatrist to start gabapentin ? - refer voice rehab Mr Valentino Saxon team ? - take 2 days of total voice rest ? - any urge to cough drink water and suck on sugarless lozenge ? ?#Followup ?-  APP followup in 4 weeks to report progress ? - RSI cough score at followuo ?

## 2021-07-02 NOTE — Progress Notes (Signed)
? ? ? ? ?OV 07/02/2021 ? ?Subjective:  ?Patient ID: Katie [name], adult , DOB: 1986-04-28 , age 36 y.o. , MRN: [id number] , ADDRESS: [address] ?[place] [id number] ?PCP Fanny Bien, MD ?Patient Care Team: ?Fanny Bien, MD as PCP - General (Family Medicine) ? ?This Provider for this visit: Treatment Team:  ?Attending Provider: Brand Males, MD ? ? ? ?07/02/2021 -   ?Chief Complaint  ?Patient presents with  ? Consult  ?  Pt states he has had a cough for the past 8 months. States the cough is worse at night but is also steady throughout the day. Will have some occasional wheezing. States the symptoms will come and go.  ? ? ? ?HPI ?Katie [name] 36 y.o. -new consult for chronic cough. Katie [name] - paddresss l In female gender "He". he tells me that he has had chronic cough since June 2022.  It is episodic.  He will have an episode the last 2 weeks every 2 months.  The cough can be quite violent and by the end of 2 weeks he gets fatigued and then has to spend a day or 2 sleeping and recovering and then he is fine till couple months later the cough..  The cough is present all day and all night.  Talking makes it worse.  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  Drinking water does make it better.  However overall no relief.  He believes he is treatment resistant.  Has not responded to antibiotics or over-the-counter medications and even prescription cough medications.  Qvar might help but only a little bit.  There is occasional wheezing.  There is also occasional shortness of breath. ? ?Cough relevant history ?- Had childhood asthma but grew out of it. ?- Has visited [place] [facility] and according to his history 2 months ago skin test was negative. ?- Does admit to postnasal drip.  Some CT [name] in the fall 2022 showed sinus thickening according to history ?- Is taking PPI for possible GERD ?- Does clear the throat. ?-Also has frequent  menstruation.  There is concern for endometriosis.  Is awaiting hysterectomy .  There is associated fatigue. ? ?- is on lot of osych meds ? ? ? ?CT Chest data ? ?No results found. ? ? Latest Reference Range & Units 11/20/15 08:41 10/04/16 12:43 01/08/21 22:48  ?Eosinophils Absolute 0.0 - 0.5 K/uL 0 (L) 0.0 0.1  ?(L): Data is abnormally low ? ?PFT ? ?No flowsheet data found. ? ? ? ? has a past medical history of Anxiety, Bipolar 1 disorder ([place]), Depression, Excessive daytime sleepiness, Fibroid, and Genital herpes. ? ? reports that she has never smoked. She has never used smokeless tobacco. ? ?Past Surgical History:  ?Procedure Laterality Date  ? NO PAST SURGERIES    ? ? ?Allergies  ?Allergen Reactions  ? Nitrofurantoin Nausea And Vomiting and Other (See Comments)  ?  Other reaction(s): Abdominal Pain, GI Intolerance  ? ? ?Immunization History  ?Administered Date(s) Administered  ? Influenza,inj,Quad PF,6+ Mos 01/13/2017, 01/31/2021  ? Unspecified SARS-COV-2 Vaccination 08/10/2019, 09/03/2019, 04/03/2020  ? ? ?Family History  ?Problem Relation Age of Onset  ? Arrhythmia Mother   ? Hypertension Father   ? Arrhythmia Maternal Grandmother   ? Diabetes Paternal Grandmother   ? Hypertension Paternal Grandmother   ? Diabetes Paternal Grandfather   ? Hypertension Paternal Grandfather   ? ? ? ?Current Outpatient Medications:  ?  acyclovir (ZOVIRAX) 400 MG tablet, Take 400 mg by mouth daily., Disp: , Rfl:  ?  albuterol (VENTOLIN HFA) 108 (90 Base) MCG/ACT inhaler, Inhale 2 puffs into the lungs every 4 (four) hours as needed., Disp: , Rfl:  ?  ALPRAZolam (XANAX) 0.25 MG tablet, Take 0.25 mg by mouth daily., Disp: , Rfl:  ?  amphetamine-dextroamphetamine (ADDERALL XR) 10 MG 24 hr capsule, Take 10 mg by mouth daily., Disp: , Rfl:  ?  Cholecalciferol (VITAMIN D3) 1.25 MG (50000 UT) CAPS, Take by mouth., Disp: , Rfl:  ?  lamoTRIgine (LAMICTAL) 100 MG tablet, Take 200 mg by mouth daily., Disp: , Rfl:  ?  levonorgestrel (MIRENA, 52  MG,) 20 MCG/DAY IUD, Mirena 21 mcg/24 hours (8 yrs) 52 mg intrauterine device, Disp: , Rfl:  ?  lithium carbonate 300 MG capsule, Take 900 mg by mouth daily., Disp: , Rfl:  ?  loratadine (CLARITIN) 10 MG tablet, 10 mg daily., Disp: , Rfl:  ?  ORILISSA 150 MG TABS, Take 1 tablet by mouth daily., Disp: , Rfl:  ?  Prenatal Vit-Fe Fumarate-FA (PRENATAL MULTIVITAMIN) TABS tablet, Take 1 tablet by mouth at bedtime., Disp: , Rfl:  ?  QUEtiapine (SEROQUEL) 25 MG tablet, Take 25 mg by mouth at bedtime., Disp: , Rfl:  ?  QVAR REDIHALER 40 MCG/ACT inhaler, SMARTSIG:1-2 Puff(s) Via Inhaler Twice Daily, Disp: , Rfl:  ?  topiramate (TOPAMAX) 50 MG tablet, Take 50 mg by mouth 2 (two) times daily., Disp: , Rfl:  ?  valACYclovir (VALTREX) 500 MG tablet, Take 500 mg by mouth daily., Disp: , Rfl:  ? ? ?   ?Objective:  ? ?Vitals:  ? 07/02/21 0906  ?BP: 118/68  ?Pulse: 82  ?Temp: 98.4 ?F (36.9 ?C)  ?TempSrc: Oral  ?SpO2: 100%  ?Weight: 135 lb 6.4 oz (61.4 kg)  ?Height: 5\' 4"  (1.626 m)  ? ? ?Estimated body mass index is 23.24 kg/m? as calculated from the following: ?  Height as of this encounter: 5\' 4"  (1.626 m). ?  Weight as of this encounter: 135 lb 6.4 oz (61.4 kg). ? ?@WEIGHTCHANGE @ ? ?Filed Weights  ? 07/02/21 0906  ?Weight: 135 lb 6.4 oz (61.4 kg)  ? ? ? Physical Exam ? ?General: No distress. Looks well ?Neuro: Alert and Oriented x 3. GCS 15. Speech normal ?Psych: Pleasant ?Resp:  Barrel Chest - no.  Wheeze - no, Crackles - no, No overt respiratory distress ?CVS: Normal heart sounds. Murmurs - no ?Ext: Stigmata of Connective Tissue Disease - no ?HEENT: Normal upper airway. PEERL +. No post nasal drip ?Did clear throat x 2 in office. ? ? ? ?   ?Assessment:  ?   ?  ICD-10-CM   ?1. Chronic cough  R05.3 Nitric oxide  ?  Pulmonary function test  ?  CT Chest High Resolution  ?  CT Maxillofacial LTD WO CM  ?  Ambulatory Referral to Neuro Rehab  ?  ?2. Irritable larynx syndrome  J38.7 Nitric oxide  ?  Pulmonary function test  ?  CT Chest  High Resolution  ?  CT Maxillofacial LTD WO CM  ?  Ambulatory Referral to Neuro Rehab  ?  ? ? ?   ?Plan:  ?   ?Patient Instructions  ? ?  ICD-10-CM   ?1. Chronic cough  R05.3   ?  ?2. Irritable larynx syndrome  J38.7   ?  ? ? ?Cough dominant feature  is cylical cough/LPR cough or cough neuropathy but need to rule  inciting causes if any. MAny times we do not know what caused it ? ?Plan ? - do HRCT supine and prone ? - do CT sinus without contrast ? - do full PFT ? - do Feno test ?  - sign release to get your allergist reocrds ?- get clearance from psychiatrist to start gabapentin ? - refer voice rehab Mr Valentino Saxon team ? - take 2 days of total voice rest ? - any urge to cough drink water and suck on sugarless lozenge ? ?#Followup ?-  APP followup in 4 weeks to report progress ? - RSI cough score at followuo ? ? ? ?SIGNATURE  ? ? ?Dr. Brand Males, M.D., F.C.C.P,  ?Pulmonary and Critical Care Medicine ?Staff Physician, [place] ?Center Director - Interstitial Lung Disease  Program  ?[facility] ?[place], [place], [address] ? ?Pager: [phone], If no answer or between  15:00h - 7:00h: call [phone] ?Telephone: [phone] ? ?9:28 AM ?07/02/2021 ? ?

## 2021-07-03 DIAGNOSIS — U099 Post covid-19 condition, unspecified: Secondary | ICD-10-CM | POA: Diagnosis not present

## 2021-07-03 DIAGNOSIS — R768 Other specified abnormal immunological findings in serum: Secondary | ICD-10-CM | POA: Diagnosis not present

## 2021-07-03 DIAGNOSIS — J45909 Unspecified asthma, uncomplicated: Secondary | ICD-10-CM | POA: Diagnosis not present

## 2021-07-03 DIAGNOSIS — R0989 Other specified symptoms and signs involving the circulatory and respiratory systems: Secondary | ICD-10-CM | POA: Diagnosis not present

## 2021-07-06 DIAGNOSIS — F319 Bipolar disorder, unspecified: Secondary | ICD-10-CM | POA: Diagnosis not present

## 2021-07-06 DIAGNOSIS — N926 Irregular menstruation, unspecified: Secondary | ICD-10-CM | POA: Diagnosis not present

## 2021-07-06 DIAGNOSIS — Z8659 Personal history of other mental and behavioral disorders: Secondary | ICD-10-CM | POA: Diagnosis not present

## 2021-07-06 DIAGNOSIS — G4730 Sleep apnea, unspecified: Secondary | ICD-10-CM | POA: Diagnosis not present

## 2021-07-08 ENCOUNTER — Other Ambulatory Visit: Payer: Self-pay

## 2021-07-08 ENCOUNTER — Ambulatory Visit (INDEPENDENT_AMBULATORY_CARE_PROVIDER_SITE_OTHER)
Admission: RE | Admit: 2021-07-08 | Discharge: 2021-07-08 | Disposition: A | Discharge status: Home / Self Care | Payer: BC Managed Care – PPO | Source: Ambulatory Visit | Attending: Internal Medicine | Admitting: Internal Medicine

## 2021-07-08 DIAGNOSIS — J387 Other diseases of larynx: Secondary | ICD-10-CM

## 2021-07-08 DIAGNOSIS — R059 Cough, unspecified: Secondary | ICD-10-CM | POA: Diagnosis not present

## 2021-07-08 DIAGNOSIS — R053 Chronic cough: Secondary | ICD-10-CM

## 2021-07-08 DIAGNOSIS — J385 Laryngeal spasm: Secondary | ICD-10-CM | POA: Diagnosis not present

## 2021-07-09 DIAGNOSIS — F3176 Bipolar disorder, in full remission, most recent episode depressed: Secondary | ICD-10-CM | POA: Diagnosis not present

## 2021-07-13 DIAGNOSIS — F3176 Bipolar disorder, in full remission, most recent episode depressed: Secondary | ICD-10-CM | POA: Diagnosis not present

## 2021-07-21 ENCOUNTER — Ambulatory Visit: Payer: BC Managed Care – PPO

## 2021-07-21 DIAGNOSIS — F3176 Bipolar disorder, in full remission, most recent episode depressed: Secondary | ICD-10-CM | POA: Diagnosis not present

## 2021-07-29 ENCOUNTER — Ambulatory Visit: Payer: BC Managed Care – PPO | Attending: Internal Medicine

## 2021-07-29 ENCOUNTER — Other Ambulatory Visit: Payer: Self-pay

## 2021-07-29 DIAGNOSIS — R498 Other voice and resonance disorders: Secondary | ICD-10-CM | POA: Insufficient documentation

## 2021-07-29 NOTE — Therapy (Signed)
[facility] ?[facility] ?[address], [address] ?[place], [place], [address] ?Phone: [phone]   Fax:  [phone] ? ?Speech Language Pathology Evaluation ? ?Patient Details  ?Name: Katie [name] ?MRN: [id number] ?Date of Birth: 09/11/1985 ?Referring Provider (SLP): Brand Males, MD ? ? ?Encounter Date: 07/29/2021 ? ? End of Session - 07/29/21 1528   ? ? Visit Number 1   ? Number of Visits 13   ? Date for SLP Re-Evaluation 09/23/21   ? SLP Start Time [phone]   ? SLP Stop Time  0845   ? SLP Time Calculation (min) 42 min   ? Activity Tolerance Patient tolerated treatment well   ? ?  ?  ? ?  ? ? ?Past Medical History:  ?Diagnosis Date  ? Anxiety   ? Bipolar 1 disorder ([place])   ? Depression   ? Excessive daytime sleepiness   ? Fibroid   ? Genital herpes   ? ? ?Past Surgical History:  ?Procedure Laterality Date  ? NO PAST SURGERIES    ? ? ?There were no vitals filed for this visit. ? ? ? ? ? ? SLP Evaluation [facility] - 07/29/21 1504   ? ?  ? SLP Visit Information  ? SLP Received On 07/28/21   ? Referring Provider (SLP) Brand Males, MD   ? Onset Date June 2022   ? Medical Diagnosis Irritable Larynx; cyclical cough   ?  ? Subjective  ? Patient/Family Stated Goal "...get some relief from the coughing!"   ?  ? General Information  ? HPI Pt with cycle through cough episodes whcih can last as long as two days which, if lasting longer than 12 hours, pt will not be able to sleep. After approx two days of this pt stated pt will sleep for ~48 hours, and at this time "will slowly start trending towards full health again." During these episodes pt will run out of breath from coughing and will take water and walk to alleviate symptoms. In visit with Dr. Chase Caller 07-02-21, he ID'd   and recommended  . Pt has not completed voice rest, to date. PMH:   ?  ? Balance Screen  ? Has the patient fallen in the past 6 months No   ?  ? Prior Functional Status  ? Cognitive/Linguistic Baseline Within functional  limits   ?  ? Cognition  ? Overall Cognitive Status Within Functional Limits for tasks assessed   ?  ? Auditory Comprehension  ? Overall Auditory Comprehension Appears within functional limits for tasks assessed   ?  ? Motor Speech  ? Overall Motor Speech Appears within functional limits for tasks assessed   ? Respiration --   clavicular and thoracic noted in >10 minutes conversation (60% and 40% respectively)  ? Phonation Normal   ? Phonation Impaired   ? Vocal Abuses Habitual Cough/Throat Clear   21 throat clears, 5 light/minimal coughs during evaluation [name]  ? Tension Present --   none noticeable  ? Volume Appropriate   ? Pitch --   slightly lower than normal for birth gender  ? ?  ?  ? ?  ? ? ? ?SLP Short Term Goals - 07/29/21    ?  ?    ?     ?  SLP SHORT TERM GOAL #1  ?  Title pt will use AB in short answers (4-7 words) 80% of the time in 2 sessions   ?  Time 4   ?  Period Weeks   ?  Status New   ?  Target Date 08/28/21   ?     ?  SLP SHORT TERM GOAL #2  ?  Title pt will use at least one relaxation strategy when he feels VCD triggered between 3 sessions   ?  Time 4   ?  Period Weeks   ?  Status New   ?  Target Date 08/28/21   ?     ?  SLP SHORT TERM GOAL #3  ?  Title pt will tell SLP 3 relaxation techniques he can use to mitigate s/sx VCD in 3 sessions   ?  Time 4   ?  Period Weeks   ?  Status New   ?  Target Date 08/28/21   ?  ?   ?  ?  ?   ?  ?  ?  SLP Long Term Goals - 07/29/21    ?  ?    ?     ?  SLP LONG TERM GOAL #1  ?  Title pt will exhibit no more than 3 throat clears and/or coughs in 3 sessions   ?  Time 8   ?  Period Weeks   ?  Status New   ?  Target Date 09/23/21   ?     ?  SLP LONG TERM GOAL #2  ?  Title pt will tell SLP he utilized relaxation techniques to quell a coughing episode/need to throat clear between 3 sessions   ?  Time 8   ?  Period Weeks   ?  Status New   ?  Target Date 09/23/21   ?     ?  SLP LONG TERM GOAL #3  ?  Title pt will demo AB with 10 minutes simple conversation in 2  sessions   ?  Time 8   ?  Period Weeks   ?  Status New   ?  Target Date 09/23/21   ?     ?  SLP LONG TERM GOAL #4  ?  Title pt will indicate progress in ST by a score > 16 on Communication Participation Survey   ?  Time 8   ?  Period Weeks   ?  Status New   ?  Target Date 09/23/21   ?   ? ? ? ? ? ? ? ? ? ? ? ? ? ? ? ? SLP Education - 07/29/21 1526   ? ? Education Details relaxation techniques, image of relaxation, abdominal breathing basics, s/sx/triggers of VCD, probable goals   ? Person(s) Educated Patient   ? Methods Explanation;Handout;Demonstration   ? Comprehension Verbalized understanding;Need further instruction   ? ?  ?  ? ?  ? ? ? ? ? ? ? Plan - 07/29/21 1528   ? ? Clinical Impression Statement Pt Katie [name] with overt s/sx vocal cord dysfucntion (VCD) AKA irritable larynx, cyclical cough c/b 21 throat clears and 5 light coughing instances in 40 minute evaluation [name]. Pt reports signs and sxs of VCD as tightness in throat/chest, wheezing, coughing and throat clearing, feeling like it is hard to get air into or out of lungs, and feeling of choking or suffocation. Pt's triggers of VCD were reported as GERD, post nasal drip, voice use, and strong emotions and stress. SLP discussed he observed pt to be thoracic and calvicular breather in conversation, and that clavicular breathing fosters VCD, as well as thoracic breathing.  SLP explained nature of abdominal breathing (AB) and how this mitigates s/sx of VCD in most speakers. SLP also educated pt re: how relaxation techniques can assist pt in stopping the cycle of cough/throat clearing in VCD. SLP was told that pt's visual imagery for "relax" was "standing on an empty beach at [place] with a blue and white-striped beach chair".  SLP educated pt on other relaxation techniques (pt's throat widening when she feels throat tickle or feels the urge to cough beginning, tickle/cough feeling evaporating or melting and feeling relaxation in the  thyroid/throat area). Pt will benefit from skilled ST teaching pt how to use abdominal breathing in conversation, as well as training pt in how to use visual imagery for relaxation of voice tract to reduce frequency and severity of cough/throat clearing, and thus incr pt's quality of life.   ? Speech Therapy Frequency 2x / week   ? Duration 8 weeks   ? Treatment/Interventions Cueing hierarchy;Functional tasks;Patient/family education;Compensatory strategies;Internal/external aids;Environmental controls;SLP instruction and feedback   ? Potential to Achieve Goals Good   ? SLP Home Exercise Plan provided [name]   ? Consulted and Agree with Plan of Care Patient   ? ?  ?  ? ?  ? ? ?Patient will benefit from skilled therapeutic intervention in order to improve the following deficits and impairments:   ?Other voice and resonance disorders - Plan: SLP plan of care cert/re-cert ? ? ? ?Problem List ?Patient Active Problem List  ? Diagnosis Date Noted  ? Indication for care in labor or delivery 01/19/2017  ? Preterm labor 01/10/2017  ? ? ?[place], [place] ?07/29/2021, 4:44 PM ? ?[facility] ?[address], [address] ?[place], [place], [address] ?Phone: [phone]   Fax:  [phone] ? ?Name: Katie [name] ?MRN: [id number] ?Date of Birth: 01/03/1986 ?

## 2021-07-29 NOTE — Patient Instructions (Signed)
Handout from [facility] on Vocal cord Dysfunction (VCD) ?

## 2021-07-31 DIAGNOSIS — F3176 Bipolar disorder, in full remission, most recent episode depressed: Secondary | ICD-10-CM | POA: Diagnosis not present

## 2021-08-03 DIAGNOSIS — J309 Allergic rhinitis, unspecified: Secondary | ICD-10-CM | POA: Diagnosis not present

## 2021-08-03 DIAGNOSIS — J069 Acute upper respiratory infection, unspecified: Secondary | ICD-10-CM | POA: Diagnosis not present

## 2021-08-03 DIAGNOSIS — J329 Chronic sinusitis, unspecified: Secondary | ICD-10-CM | POA: Diagnosis not present

## 2021-08-03 DIAGNOSIS — B9689 Other specified bacterial agents as the cause of diseases classified elsewhere: Secondary | ICD-10-CM | POA: Diagnosis not present

## 2021-08-04 ENCOUNTER — Telehealth: Payer: Self-pay | Admitting: Internal Medicine

## 2021-08-04 DIAGNOSIS — R053 Chronic cough: Secondary | ICD-10-CM

## 2021-08-04 DIAGNOSIS — G4700 Insomnia, unspecified: Secondary | ICD-10-CM | POA: Diagnosis not present

## 2021-08-04 DIAGNOSIS — F411 Generalized anxiety disorder: Secondary | ICD-10-CM | POA: Diagnosis not present

## 2021-08-04 DIAGNOSIS — G4733 Obstructive sleep apnea (adult) (pediatric): Secondary | ICD-10-CM | POA: Diagnosis not present

## 2021-08-04 DIAGNOSIS — J385 Laryngeal spasm: Secondary | ICD-10-CM | POA: Diagnosis not present

## 2021-08-04 NOTE — Telephone Encounter (Signed)
We can do a virtual visit if she is acutely ill.  Ill do my best to answer her questions but she really ought to do PFT to assess for obstructive lung disease and monitor lung function. It is an essential part to her work up. Without it we dont necessarily have a clear picture of what is going on

## 2021-08-04 NOTE — Telephone Encounter (Addendum)
Lmtcb for pt to see if pt wanted telephone or video visit.  ?

## 2021-08-04 NOTE — Telephone Encounter (Signed)
Called and spoke to pt. Pt has OV (with Beth) and PFT scheduled for 08/06/21. Pt states she is not feeling well enough to perform PFT. Per pt's request PFT has been cancelled. Pt wanted to keep OV with Beth as pt states she has a lot of questions she would like to discuss with Beth. Pt is requesting this appt be changed to a telephone/video visit.  ? ?Beth, please advise. Thanks! ?

## 2021-08-05 ENCOUNTER — Ambulatory Visit: Payer: BC Managed Care – PPO

## 2021-08-05 DIAGNOSIS — F319 Bipolar disorder, unspecified: Secondary | ICD-10-CM | POA: Diagnosis not present

## 2021-08-05 DIAGNOSIS — Z8659 Personal history of other mental and behavioral disorders: Secondary | ICD-10-CM | POA: Diagnosis not present

## 2021-08-05 DIAGNOSIS — G4730 Sleep apnea, unspecified: Secondary | ICD-10-CM | POA: Diagnosis not present

## 2021-08-05 DIAGNOSIS — N926 Irregular menstruation, unspecified: Secondary | ICD-10-CM | POA: Diagnosis not present

## 2021-08-06 ENCOUNTER — Telehealth (INDEPENDENT_AMBULATORY_CARE_PROVIDER_SITE_OTHER): Payer: BC Managed Care – PPO | Admitting: Primary Care

## 2021-08-06 DIAGNOSIS — J320 Chronic maxillary sinusitis: Secondary | ICD-10-CM | POA: Diagnosis not present

## 2021-08-06 DIAGNOSIS — R053 Chronic cough: Secondary | ICD-10-CM | POA: Diagnosis not present

## 2021-08-06 DIAGNOSIS — J387 Other diseases of larynx: Secondary | ICD-10-CM | POA: Diagnosis not present

## 2021-08-06 DIAGNOSIS — F900 Attention-deficit hyperactivity disorder, predominantly inattentive type: Secondary | ICD-10-CM | POA: Insufficient documentation

## 2021-08-06 NOTE — Progress Notes (Signed)
Virtual Visit via Video Note ? ?I connected with Katie [name] on 08/06/21 at 12:00 PM EDT by a video enabled telemedicine application and verified that I am speaking with the correct person using two identifiers. ? ?Location: ?Patient: Home ?Provider: Office ?  ?I discussed the limitations of evaluation and management by telemedicine and the availability of in person appointments. The patient expressed understanding and agreed to proceed. ? ?History of Present Illness: ?36 year old transgender female, legal sex is female.  Medical history significant for asthma, ADHD, endometriosis, PTSD, bipolar disorder.  Patient Dr. Chase Caller, seen for initial consult on 07/02/2021 for chronic cough. ? ?Previous [facility] pulmonary encounter: ?07/02/2021 -   ?    ?Chief Complaint  ?Patient presents with  ? Consult  ?    Pt states he has had a cough for the past 8 months. States the cough is worse at night but is also steady throughout the day. Will have some occasional wheezing. States the symptoms will come and go.  ?  ?HPI ?Katie [name] 36 y.o. -new consult for chronic cough. Katie [name] - paddresss l In female gender "He". he tells me that he has had chronic cough since June 2022.  It is episodic.  He will have an episode the last 2 weeks every 2 months.  The cough can be quite violent and by the end of 2 weeks he gets fatigued and then has to spend a day or 2 sleeping and recovering and then he is fine till couple months later the cough..  The cough is present all day and all night.  Talking makes it worse.  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  Drinking water does make it better.  However overall no relief.  He believes he is treatment resistant.  Has not responded to antibiotics or over-the-counter medications and even prescription cough medications.  Qvar might help but only a little bit.  There is occasional wheezing.  There is also occasional shortness of breath. ? ?Cough relevant  history ?- Had childhood asthma but grew out of it. ?- Has visited [facility] allergy clinic and according to his history 2 months ago skin test was negative. ?- Does admit to postnasal drip.  Some CT [name] in the fall 2022 showed sinus thickening according to history ?- Is taking PPI for possible GERD ?- Does clear the throat. ?-Also has frequent menstruation.  There is concern for endometriosis.  Is awaiting hysterectomy .  There is associated fatigue. ?  ?- is on lot of osych meds ?  ? ?08/06/2021 - Interim hx  ?We were able to connect momentarily to video visit, he had service issues and video ended. We finished out visit by way of televisit. Patient has had a cough for 1 week with associated's rhinitis. His symptoms frequently come and go. This is the fourth time he has had bronchitis since June 2022. He states that stress will induce cough. He is currently participating in vocal rehab. He had frequent throat clearing. Associated sinus pressure and drainage. He is taking Qvar on regular basis, but states that when he is sick it actually makes his cough worse. He is also taking loratidine '10mg'$  daily and omeprazole '20mg'$  once daily. He ha not started nasal sprays that were prescribed by his PCP but plans to. He is currently also on abx/prednisone. HRCT in March 2023 showed no evidence of interstitial lung disease and CT maxillary sinuses without substantial apparent nasal sinus disease.  ?  ?  Observations/Objective: ? ?- Appears well; No overt sob/wheezing. Frequent throat clearing ? ?Assessment and Plan: ? ?Chronic cough: ?- PND and GERD contributing. Needs PFTs to assess for obstructive lung disease  ?- High resolution CT chest without ILD or lung abnormality ? ?Irritable larynx: ?- Referred to neuro rehab ?- Plan increase omeprazole '20mg'$  to twice daily  ? ?PND/chronic sinusitis:  ?- CT maxillofacial showed mild mucosal thickening in the inferior maxillary sinuses  ?- Start Astelin and Ipratropium nasal spray as  directed  ?- Continue loratadine '10mg'$  daily  ?- Refer to ENT  ? ?Childhood asthma:  ?- Continue Qvar 54mg for the time being, may discontinue to observe cough off ICS inhaler ?- Needs to reschedule PFTs and FENO to assess lung function  ? ?Follow Up Instructions: ? ? - After PFTs  ? ?I discussed the assessment and treatment plan with the patient. The patient was provided an opportunity to ask questions and all were answered. The patient agreed with the plan and demonstrated an understanding of the instructions. ?  ?The patient was advised to call back or seek an in-person evaluation if the symptoms worsen or if the condition fails to improve as anticipated. ? ?I provided 27 minutes of non-face-to-face time during this encounter. ? ? ?EMartyn Ehrich NP ? ?

## 2021-08-06 NOTE — Telephone Encounter (Signed)
Spoke with the pt and scheduled PFT with Feno and OV with BW on 10/01/21. Nothing further needed.  ?

## 2021-08-06 NOTE — Telephone Encounter (Signed)
Patient is on Beth's schedule for 12pm today. Will close encounter.  ?

## 2021-08-06 NOTE — Addendum Note (Signed)
Addended by: Rosana Berger on: 08/06/2021 01:58 PM ? ? Modules accepted: Orders ? ?

## 2021-08-06 NOTE — Telephone Encounter (Signed)
Can we please rescheduled PFTs and FENO. Call patient to schedule. Thanks  ?

## 2021-08-10 ENCOUNTER — Ambulatory Visit: Payer: BC Managed Care – PPO | Attending: Internal Medicine

## 2021-08-10 DIAGNOSIS — R498 Other voice and resonance disorders: Secondary | ICD-10-CM | POA: Diagnosis not present

## 2021-08-10 DIAGNOSIS — F3176 Bipolar disorder, in full remission, most recent episode depressed: Secondary | ICD-10-CM | POA: Diagnosis not present

## 2021-08-10 NOTE — Therapy (Signed)
[place] ?[facility] ?[address], [address] ?[place], [place], [address] ?Phone: [phone]   Fax:  [phone] ? ?Speech Language Pathology Treatment ? ?Patient Details  ?Name: Katie [name] ?MRN: [id number] ?Date of Birth: 23-Aug-1985 ?Referring Provider (SLP): Brand Males, MD ? ? ?Encounter Date: 08/10/2021 ? ? End of Session - 08/10/21 1215   ? ? Visit Number 2   ? Number of Visits 13   ? Date for SLP Re-Evaluation 09/23/21   ? SLP Start Time 1017   ? SLP Stop Time  1057   ? SLP Time Calculation (min) 40 min   ? Activity Tolerance Patient tolerated treatment well   ? ?  ?  ? ?  ? ? ?Past Medical History:  ?Diagnosis Date  ? Anxiety   ? Bipolar 1 disorder ([place])   ? Depression   ? Excessive daytime sleepiness   ? Fibroid   ? Genital herpes   ? ? ?Past Surgical History:  ?Procedure Laterality Date  ? NO PAST SURGERIES    ? ? ?There were no vitals filed for this visit. ? ? Subjective Assessment - 08/10/21 1021   ? ? Subjective "It was that thing that brought me here in the first place. It was less severe and less prolonged than usual."   ? Currently in Pain? No/denies   ? ?  ?  ? ?  ? ? ? ? ? ? ? ? ADULT SLP TREATMENT - 08/10/21 1022   ? ?  ? General Information  ? Behavior/Cognition Alert;Cooperative;Pleasant mood   ?  ? Treatment Provided  ? Treatment provided Cognitive-Linquistic   ?  ? Cognitive-Linquistic Treatment  ? Treatment focused on Voice   ? Skilled Treatment When Katie [name] is stressed Katie [name] feels a "corset-like" squeezing of the abdominals and chest and this makes abdominal breathing (AB) more challenging. Pt sees a psychiatric NP and a therapist and will tell these people these feelings (somatosensory and emotional). Today in conversation in the fist 10 minutes Katie [name] had 10 throat clears. After focus on abdominal breathing was initiated, pt had 8 throat clears in the next 30 minutes (none in the 15 minutes pt focused on solely AB at rest and in non-speech vocalization).  Pt was 100% successful at putting AB in non speech vocal sounds with question, affirmative, and negation intonation. Katie [name] was told to cont AB at rest, and ping her phone every hour to focus more on AB being brought into the normal day.   ?  ? Assessment / Recommendations / Plan  ? Plan Continue with current plan of care   ?  ? Progression Toward Goals  ? Progression toward goals Progressing toward goals   ? ?  ?  ? ?  ? ? ? SLP Education - 08/10/21 1214   ? ? Education Details AB, bringing AB more into her day   ? Person(s) Educated Patient   ? Methods Explanation;Demonstration;Verbal cues   ? Comprehension Verbalized understanding;Returned demonstration;Verbal cues required;Need further instruction   ? ?  ?  ? ?  ? ?SLP Short Term Goals - 08/10/21    ?  ?       ?       ?  SLP SHORT TERM GOAL #1  ?  Title pt will use AB in short answers (4-7 words) 80% of the time in 2 sessions   ?  Time 4   ?  Period Weeks   ?  Status New   ?  Target Date 08/28/21   ?       ?  SLP SHORT TERM GOAL #2  ?  Title pt will use at least one relaxation strategy when he feels VCD triggered between 3 sessions   ?  Time 4   ?  Period Weeks   ?  Status New   ?  Target Date 08/28/21   ?       ?  SLP SHORT TERM GOAL #3  ?  Title pt will tell SLP 3 relaxation techniques he can use to mitigate s/sx VCD in 3 sessions   ?  Time 4   ?  Period Weeks   ?  Status New   ?  Target Date 08/28/21   ?  ?   ?  ?  ?   ?  ?  ?  SLP Long Term Goals - 08/10/21    ?  ?       ?       ?  SLP LONG TERM GOAL #1  ?  Title pt will exhibit no more than 3 throat clears and/or coughs in 3 sessions   ?  Time 8   ?  Period Weeks   ?  Status New   ?  Target Date 09/23/21   ?       ?  SLP LONG TERM GOAL #2  ?  Title pt will tell SLP he utilized relaxation techniques to quell a coughing episode/need to throat clear between 3 sessions   ?  Time 8   ?  Period Weeks   ?  Status New   ?  Target Date 09/23/21   ?       ?  SLP LONG TERM GOAL #3  ?  Title pt will demo AB with 10  minutes simple conversation in 2 sessions   ?  Time 8   ?  Period Weeks   ?  Status New   ?  Target Date 09/23/21   ?       ?  SLP LONG TERM GOAL #4  ?  Title pt will indicate progress in ST by a score > 16 on Communication Participation Survey   ?  Time 8   ?  Period Weeks   ?  Status New   ?  Target Date 09/23/21   ?  ? ? ? ? ? ? Plan - 08/10/21 1215   ? ? Clinical Impression Statement Katie [name] decr'd throat clears to <20 overall today and significantly less when focusing on abdominal breathing (AB). Pt will cont to benefit from skilled ST teaching pt how to use abdominal breathing in conversation, as well as training pt in how to use visual imagery for relaxation of voice tract to reduce frequency and severity of cough/throat clearing, and thus incr pt's quality of life.    ? Speech Therapy Frequency 2x / week   ? Duration 8 weeks   ? Treatment/Interventions Cueing hierarchy;Functional tasks;Patient/family education;Compensatory strategies;Internal/external aids;Environmental controls;SLP instruction and feedback   ? Potential to Achieve Goals Good   ? SLP Home Exercise Plan provided today   ? Consulted and Agree with Plan of Care Patient   ? ?  ?  ? ?  ? ? ?Patient will benefit from skilled therapeutic intervention in order to improve the following deficits and impairments:   ?Other voice and resonance disorders ? ? ? ?Problem List ?Patient Active Problem List  ? Diagnosis Date Noted  ? Attention deficit  hyperactivity disorder, predominantly inattentive type 08/06/2021  ? Asthma 05/07/2021  ? Endometriosis 10/12/2020  ? PTSD (post-traumatic stress disorder) 10/12/2020  ? Bipolar disorder ([place]) 07/07/2018  ? Herpes simplex 07/07/2018  ? Indication for care in labor or delivery 01/19/2017  ? Preterm labor 01/10/2017  ? ? ?[place], [place] ?08/10/2021, 12:20 PM ? ?[place] [facility] ?[facility] ?[address] ?[place], [place], [address] ?Phone: [phone]   Fax:   [phone] ? ? ?Name: Katie [name] ?MRN: [id number] ?Date of Birth: [birth year] ? ?

## 2021-08-14 ENCOUNTER — Ambulatory Visit: Payer: BC Managed Care – PPO

## 2021-08-14 DIAGNOSIS — R498 Other voice and resonance disorders: Secondary | ICD-10-CM | POA: Diagnosis not present

## 2021-08-14 NOTE — Therapy (Signed)
[place] ?[facility] ?[address], [address] ?[place], [place], [address] ?Phone: [phone]   Fax:  [phone] ? ?Speech Language Pathology Treatment ? ?Patient Details  ?Name: Katie [name] ?MRN: [id number] ?Date of Birth: [birth year] ?Referring Provider (SLP): Brand Males, MD ? ? ?Encounter Date: 08/14/2021 ? ? End of Session - 08/14/21 0905   ? ? Visit Number 3   ? Number of Visits 13   ? Date for SLP Re-Evaluation 09/23/21   ? SLP Start Time [phone]   ? SLP Stop Time  0845   ? SLP Time Calculation (min) 41 min   ? Activity Tolerance Patient tolerated treatment well   ? ?  ?  ? ?  ? ? ?Past Medical History:  ?Diagnosis Date  ? Anxiety   ? Bipolar 1 disorder ([place])   ? Depression   ? Excessive daytime sleepiness   ? Fibroid   ? Genital herpes   ? ? ?Past Surgical History:  ?Procedure Laterality Date  ? NO PAST SURGERIES    ? ? ?There were no vitals filed for this visit. ? ? Subjective Assessment - 08/14/21 0807   ? ? Subjective "If I start coughing and clearing my throat a lot - I realize that something is stressing me out."   ? ?  ?  ? ?  ? ? ? ? ? ? ? ? ADULT SLP TREATMENT - 08/14/21 0811   ? ?  ? General Information  ? Behavior/Cognition Alert;Cooperative;Pleasant mood   ?  ? Treatment Provided  ? Treatment provided Cognitive-Linquistic   ?  ? Cognitive-Linquistic Treatment  ? Treatment focused on Voice   ? Skilled Treatment Pt's realization that pt's VCD sx are partly psychological in nature is very important. Katie [name] uses previously discussed relaxation techniques when VCD sx occur. Katie [name] asked about the upper register used in singing and questioned why this sounded more harsh ("Jagged" by pt). SLP thought that pt might have a habit of pushing/straining with upper register singing voice and provided this explanation for pt. SLP talked with pt about external cues for incr'd mindfulness of abdominal breathing (AB) use.Pt will use this technique and report back to SLP if pt has had  any further reduction in VCD s/sx with this focus until next ST session.  4 throat clears and 3 coughs today, however pt attributes a large portion of these to post-nasal drip/sinus.   ?  ? Assessment / Recommendations / Plan  ? Plan Continue with current plan of care   ?  ? Progression Toward Goals  ? Progression toward goals Progressing toward goals   ? ?  ?  ? ?  ? ? ? SLP Education - 08/14/21 0904   ? ? Education Details external cues for encouraging AB use, voice use in higher register   ? Person(s) Educated Patient   ? Methods Explanation   ? Comprehension Verbalized understanding   ? ?  ?  ? ?  ? ?SLP Short Term Goals - 08/14/21    ?  ?       ?       ?  SLP SHORT TERM GOAL #1  ?  Title pt will use AB in short answers (4-7 words) 80% of the time in 2 sessions   ?  Time 4   ?  Period Weeks   ?  Status New   ?  Target Date 08/28/21   ?       ?  SLP SHORT TERM GOAL #2  ?  Title pt will use at least one relaxation strategy when he feels VCD triggered between 3 sessions   ?  Time 4    08-14-21  ?  Period Weeks   ?  Status New   ?  Target Date 08/28/21   ?       ?  SLP SHORT TERM GOAL #3  ?  Title pt will tell SLP 3 relaxation techniques he can use to mitigate s/sx VCD in 3 sessions   ?  Time 4    08-14-21  ?  Period Weeks   ?  Status New   ?  Target Date 08/28/21   ?  ?   ?  ?  ?   ?  ?  ?  SLP Long Term Goals - 08/14/21    ?  ?       ?       ?  SLP LONG TERM GOAL #1  ?  Title pt will exhibit no more than 3 throat clears and/or coughs in 3 sessions   ?  Time 8   ?  Period Weeks   ?  Status New   ?  Target Date 09/23/21   ?       ?  SLP LONG TERM GOAL #2  ?  Title pt will tell SLP he utilized relaxation techniques to quell a coughing episode/need to throat clear between 3 sessions   ?  Time 8   08-14-21  ?  Period Weeks   ?  Status New   ?  Target Date 09/23/21   ?       ?  SLP LONG TERM GOAL #3  ?  Title pt will demo AB with 10 minutes simple conversation in 2 sessions   ?  Time 8   ?  Period Weeks   ?  Status New   ?   Target Date 09/23/21   ?       ?  SLP LONG TERM GOAL #4  ?  Title pt will indicate progress in ST by a score > 16 on Communication Participation Survey   ?  Time 8   ?  Period Weeks   ?  Status New   ?  Target Date 09/23/21  ?  ? ? ?Plan - 08/14/21   ?  ?  Clinical Impression Statement Katie [name] decr'd throat clears to <20 overall today and significantly less when focusing on abdominal breathing (AB). Pt will cont to benefit from skilled ST teaching pt how to use abdominal breathing in conversation, as well as training pt in how to use visual imagery for relaxation of voice tract to reduce frequency and severity of cough/throat clearing, and thus incr pt's quality of life.    ?  Speech Therapy Frequency 2x / week   ?  Duration 8 weeks   ?  Treatment/Interventions Cueing hierarchy;Functional tasks;Patient/family education;Compensatory strategies;Internal/external aids;Environmental controls;SLP instruction and feedback   ?  Potential to Achieve Goals Good   ? ? ? ? ? Plan - 08/14/21 0905   ? ? Speech Therapy Frequency 2x / week   ? Duration 8 weeks   ? Treatment/Interventions Cueing hierarchy;Functional tasks;Patient/family education;Compensatory strategies;Internal/external aids;Environmental controls;SLP instruction and feedback   ? Potential to Achieve Goals Good   ? SLP Home Exercise Plan provided today   ? Consulted and Agree with Plan of Care Patient   ? ?  ?  ? ?  ? ? ?  Patient will benefit from skilled therapeutic intervention in order to improve the following deficits and impairments:   ?Other voice and resonance disorders ? ? ? ?Problem List ?Patient Active Problem List  ? Diagnosis Date Noted  ? Attention deficit hyperactivity disorder, predominantly inattentive type 08/06/2021  ? Asthma 05/07/2021  ? Endometriosis 10/12/2020  ? PTSD (post-traumatic stress disorder) 10/12/2020  ? Bipolar disorder ([place]) 07/07/2018  ? Herpes simplex 07/07/2018  ? Indication for care in labor or delivery 01/19/2017  ? Preterm  labor 01/10/2017  ? ? ?[place], [place] ?08/14/2021, 9:06 AM ? ?[place] ?[facility] ?[address] ?[place], [place], [address] ?Phone: [phone]   Fax:  [phone] ? ? ?Name: Katie [name] ?MRN: [id number] ?Date of Birth: 1985/10/10 ? ?

## 2021-08-14 NOTE — Patient Instructions (Signed)
?  Wear an external cue to encourage you to use abdominal breathing until we see each other again. ?

## 2021-08-19 ENCOUNTER — Ambulatory Visit: Payer: BC Managed Care – PPO | Admitting: Neurology

## 2021-08-19 ENCOUNTER — Ambulatory Visit: Payer: BC Managed Care – PPO

## 2021-08-19 DIAGNOSIS — G4733 Obstructive sleep apnea (adult) (pediatric): Secondary | ICD-10-CM

## 2021-08-19 DIAGNOSIS — R498 Other voice and resonance disorders: Secondary | ICD-10-CM | POA: Diagnosis not present

## 2021-08-19 NOTE — Therapy (Signed)
[facility] ?[facility] ?[address], [address] ?[place], [place], [address] ?Phone: [phone]   Fax:  [phone] ? ?Speech Language Pathology Treatment ? ?Patient Details  ?Name: Katie [name] ?MRN: [id number] ?Date of Birth: April 24, 1986 ?Referring Provider (SLP): Brand Males, MD ? ? ?Encounter Date: 08/19/2021 ? ? End of Session - 08/19/21 1436   ? ? Visit Number 4   ? Number of Visits 13   ? Date for SLP Re-Evaluation 09/23/21   ? SLP Start Time 1318   ? SLP Stop Time  1400   ? SLP Time Calculation (min) 42 min   ? Activity Tolerance Patient tolerated treatment well   ? ?  ?  ? ?  ? ? ?Past Medical History:  ?Diagnosis Date  ? Anxiety   ? Bipolar 1 disorder ([place])   ? Depression   ? Excessive daytime sleepiness   ? Fibroid   ? Genital herpes   ? ? ?Past Surgical History:  ?Procedure Laterality Date  ? NO PAST SURGERIES    ? ? ?There were no vitals filed for this visit. ? ? Subjective Assessment - 08/19/21 1321   ? ? Subjective Pt reports 30-35% reduction in sx of VCD.   ? Currently in Pain? No/denies   ? ?  ?  ? ?  ? ? ? ? ? ? ? ? ADULT SLP TREATMENT - 08/19/21 1323   ? ?  ? General Information  ? Behavior/Cognition Alert;Cooperative;Pleasant mood   ?  ? Treatment Provided  ? Treatment provided Cognitive-Linquistic   ?  ? Cognitive-Linquistic Treatment  ? Treatment focused on Voice   ? Skilled Treatment Pt is focusing more on mindfulness using AB throughout the day and 30-35% of VCD s/sx have been alleviated. Pt related an example this morning how the higher register seemed to foster more frequent s/sx VCD. SLP had pt talk in this register and SLP heard more harshness/tightness in the quality than when pt talks in the lower register. SLP hypothesis is that more tightness in teh voice in higher register fosters s/sx VCD. Pt agreed. Pt again affirmed that stress is a trigger for her VCD, stating that in [place] room very little stress felt but when pt arrives in other environments  stress increases and pt's s/sx VCD increase. Two throat clears today during session. Lastly, pt and SLP agreed to cx pt's appointment for Friday to allow more time to pass before pt's next appointment - [name] will cont to focus on AB during the day and see if further reduction be made wiht VCD s/sx. Pt was provided info on gender-affirming voice program at [facility]   ?  ? Assessment / Recommendations / Plan  ? Plan Continue with current plan of care   ?  ? Progression Toward Goals  ? Progression toward goals Progressing toward goals   ? ?  ?  ? ?  ? ? ? SLP Education - 08/19/21 1436   ? ? Education Details [employer] voice program   ? Person(s) Educated Patient   ? Methods Handout;Explanation   ? Comprehension Verbalized understanding   ? ?  ?  ? ?  ? ? ? ?SLP Short Term Goals - 08/19/21    ?  ?       ?       ?  SLP SHORT TERM GOAL #1  ?  Title pt will use AB in short answers (4-7 words) 80% of the time in 2 sessions   ?  Time 4   08-19-21  ?  Period Weeks   ?  Status New   ?  Target Date 08/28/21   ?       ?  SLP SHORT TERM GOAL #2  ?  Title pt will use at least one relaxation strategy when pt feels VCD triggered between 3 sessions   ?  Time 4    08-14-21  ?  Period Weeks   ?  Status New   ?  Target Date 08/28/21   ?       ?  SLP SHORT TERM GOAL #3  ?  Title pt will tell SLP 3 relaxation techniques he can use to mitigate s/sx VCD in 3 sessions   ?  Time 4    08-14-21  ?  Period Weeks   ?  Status New   ?  Target Date 08/28/21   ?  ?   ?  ?  ?   ?  ?  ?  SLP Long Term Goals - 08/19/21    ?  ?       ?       ?  SLP LONG TERM GOAL #1  ?  Title pt will exhibit no more than 3 throat clears and/or coughs in 3 sessions   ?  Time 8     08-19-21  ?  Period Weeks   ?  Status New   ?  Target Date 09/23/21   ?       ?  SLP LONG TERM GOAL #2  ?  Title pt will tell SLP pt utilized relaxation techniques to quell a coughing episode/need to throat clear between 3 sessions   ?  Time 8   08-14-21  ?  Period Weeks   ?  Status New   ?  Target Date  09/23/21   ?       ?  SLP LONG TERM GOAL #3  ?  Title pt will demo AB with 10 minutes simple conversation in 2 sessions   ?  Time 8     08-19-21  ?  Period Weeks   ?  Status New   ?  Target Date 09/23/21   ?       ?  SLP LONG TERM GOAL #4  ?  Title pt will indicate progress in ST by a score > 16 on Communication Participation Survey   ?  Time 8   ?  Period Weeks   ?  Status New   ?  Target Date 09/23/21  ?  ?  ?  ?Plan - 08/19/21   ?  ?  Clinical Impression Statement [name] decr'd throat clears to 2 overall today when focusing on abdominal breathing (AB). Pt will cont to benefit from skilled ST teaching pt how to use abdominal breathing in conversation, as well as training pt in how to use visual imagery for relaxation of voice tract to reduce frequency and severity of cough/throat clearing, and thus incr pt's quality of life.    ? ?   ? ? Speech Therapy Frequency 2x / week   ? Duration 8 weeks   ? Treatment/Interventions Cueing hierarchy;Functional tasks;Patient/family education;Compensatory strategies;Internal/external aids;Environmental controls;SLP instruction and feedback   ? Potential to Achieve Goals Good   ? SLP Home Exercise Plan provided today   ? Consulted and Agree with Plan of Care Patient   ? ?  ?  ? ?  ? ? ?Patient will benefit from skilled  therapeutic intervention in order to improve the following deficits and impairments:   ?Other voice and resonance disorders ? ? ? ?Problem List ?Patient Active Problem List  ? Diagnosis Date Noted  ? Attention deficit hyperactivity disorder, predominantly inattentive type 08/06/2021  ? Asthma 05/07/2021  ? Endometriosis 10/12/2020  ? PTSD (post-traumatic stress disorder) 10/12/2020  ? Bipolar disorder ([place]) 07/07/2018  ? Herpes simplex 07/07/2018  ? Indication for care in labor or delivery 01/19/2017  ? Preterm labor 01/10/2017  ? ? ?[place], [place] ?08/19/2021, 2:37 PM ? ?[facility] ?[facility] ?[address], [address] ?[place], [place], [address] ?Phone: [phone]   Fax:  [phone] ? ? ?Name: Katie [name] ?MRN: [id number] ?Date of Birth: 1986/04/20 ? ?

## 2021-08-19 NOTE — Patient Instructions (Addendum)
? ? ?  We see clients seeking any gender affirming voice work, so we have worked with those who are transmasculine, transfeminine, and nonbinary. We typically work on voice and resonance and sometimes nonverbal and spoken communication...whatever supports the client's authenticity and satisfaction. The sessions are about an hour long, and most are once a week.  ? ?I'd love to talk with your patient. This semester is almost over, and we won't be seeing new clients until the fall semester. If they can wait until then, please give them my contact information. Otherwise, [email or web address] is out of [place] and also provides these services. ? ?Sena Crutchley, MA, CCC-SLP ?AP Associate Professor ?Department of Communication Sciences and Disorders ?[employer] ?[phone] ? ?

## 2021-08-20 ENCOUNTER — Telehealth: Payer: Self-pay

## 2021-08-20 ENCOUNTER — Telehealth: Payer: Self-pay | Admitting: Adult Health

## 2021-08-20 DIAGNOSIS — F3176 Bipolar disorder, in full remission, most recent episode depressed: Secondary | ICD-10-CM | POA: Diagnosis not present

## 2021-08-20 NOTE — Telephone Encounter (Signed)
Pt said was suppose to do a Home Sleep Study last night. Was out of her routine and forgot to do the study. Would like a call back to reschedule and what should I do? ?

## 2021-08-20 NOTE — Telephone Encounter (Signed)
Returned pt's call and rescheduled the HST pick up appointment ?

## 2021-08-21 ENCOUNTER — Ambulatory Visit: Payer: BC Managed Care – PPO

## 2021-08-24 ENCOUNTER — Ambulatory Visit (INDEPENDENT_AMBULATORY_CARE_PROVIDER_SITE_OTHER): Payer: BC Managed Care – PPO | Admitting: Neurology

## 2021-08-24 ENCOUNTER — Ambulatory Visit: Payer: BC Managed Care – PPO

## 2021-08-24 DIAGNOSIS — G4733 Obstructive sleep apnea (adult) (pediatric): Secondary | ICD-10-CM | POA: Diagnosis not present

## 2021-08-24 DIAGNOSIS — Z8669 Personal history of other diseases of the nervous system and sense organs: Secondary | ICD-10-CM

## 2021-08-25 DIAGNOSIS — G4730 Sleep apnea, unspecified: Secondary | ICD-10-CM | POA: Diagnosis not present

## 2021-08-25 DIAGNOSIS — F319 Bipolar disorder, unspecified: Secondary | ICD-10-CM | POA: Diagnosis not present

## 2021-08-25 DIAGNOSIS — N939 Abnormal uterine and vaginal bleeding, unspecified: Secondary | ICD-10-CM | POA: Diagnosis not present

## 2021-08-25 DIAGNOSIS — N926 Irregular menstruation, unspecified: Secondary | ICD-10-CM | POA: Diagnosis not present

## 2021-08-25 DIAGNOSIS — Z8659 Personal history of other mental and behavioral disorders: Secondary | ICD-10-CM | POA: Diagnosis not present

## 2021-08-27 ENCOUNTER — Ambulatory Visit: Payer: BC Managed Care – PPO

## 2021-08-27 DIAGNOSIS — R498 Other voice and resonance disorders: Secondary | ICD-10-CM

## 2021-08-27 NOTE — Therapy (Signed)
[place] ?[facility] ?[address], [address] ?[place], [place], [address] ?Phone: [phone]   Fax:  [phone] ? ?Speech Language Pathology Treatment ? ?Patient Details  ?Name: Katie [name] ?MRN: [id number] ?Date of Birth: 17-May-1985 ?Referring Provider (SLP): Brand Males, MD ? ? ?Encounter Date: 08/27/2021 ? ? End of Session - 08/27/21 1658   ? ? Visit Number 5   ? Number of Visits 13   ? Date for SLP Re-Evaluation 09/23/21   ? SLP Start Time 1545   13 mintues late  ? SLP Stop Time  1616   ? SLP Time Calculation (min) 31 min   ? Activity Tolerance Patient tolerated treatment well   ? ?  ?  ? ?  ? ? ?Past Medical History:  ?Diagnosis Date  ? Anxiety   ? Bipolar 1 disorder ([place])   ? Depression   ? Excessive daytime sleepiness   ? Fibroid   ? Genital herpes   ? ? ?Past Surgical History:  ?Procedure Laterality Date  ? NO PAST SURGERIES    ? ? ?There were no vitals filed for this visit. ? ? Subjective Assessment - 08/27/21 1546   ? ? Subjective Pt has been off of GERD medicine for 3-4 days so VCD sx worse. Pt also at a conference all week adn is talking more.   ? Currently in Pain? No/denies   ? ?  ?  ? ?  ? ? ? ? ? ? ? ? ADULT SLP TREATMENT - 08/27/21 1547   ? ?  ? General Information  ? Behavior/Cognition Alert;Cooperative;Pleasant mood   ?  ? Treatment Provided  ? Treatment provided Cognitive-Linquistic   ?  ? Cognitive-Linquistic Treatment  ? Treatment focused on Voice   ? Skilled Treatment 13 minutes late. Pt noticed that abdominal breathing occurred when not focusing on it at rest. Arrived today with 6 throat clears within first 5 minutes. SLP cued for sips water instead of thraot clearing and pt with two clears the remaining 20 minutes. Pt states that pt has gone into higher register and this brings on VCD sx due to not abdominal breathing. SLP and pt problem solved cost-benefit of treating pt's higher register and decided that pt has less attention to detail when the  higher register occurs and it would not be advantageous to treat upper register. SLP then worked on [place] with pt in 2-minute more "exciting" topics that pt stated was losing whether AB was occurring or not. Pt successful in 4/5 segments with AB >85% of the time   ?  ? Assessment / Recommendations / Plan  ? Plan Continue with current plan of care   ?  ? Progression Toward Goals  ? Progression toward goals Progressing toward goals   ? ?  ?  ? ?  ? ? ? ?SLP Short Term Goals - 08/27/21    ?  ?       ?       ?  SLP SHORT TERM GOAL #1  ?  Title pt will use AB in short answers (4-7 words) 80% of the time in 2 sessions   ?  Time 4   08-19-21  ?  Period Weeks   ?  Status Met   ?  Target Date 08/28/21   ?       ?  SLP SHORT TERM GOAL #2  ?  Title pt will use at least one relaxation strategy when pt feels VCD triggered  between 3 sessions   ?  Time 4    08-14-21  ?  Period Weeks   ?  Status Partially met   ?  Target Date 08/28/21   ?       ?  SLP SHORT TERM GOAL #3  ?  Title pt will tell SLP 3 relaxation techniques he can use to mitigate s/sx VCD in 3 sessions   ?  Time 4    08-14-21, 08-27-21  ?  Period Weeks   ?  Status Partially met   ?  Target Date 08/28/21   ?  ?   ?  ?  ?   ?  ?  ?  SLP Long Term Goals - 08/27/21    ?  ?       ?       ?  SLP LONG TERM GOAL #1  ?  Title pt will exhibit no more than 3 throat clears and/or coughs in 3 sessions   ?  Time 8     08-19-21  ?  Period Weeks   ?  Status Ongoing   ?  Target Date 09/23/21   ?       ?  SLP LONG TERM GOAL #2  ?  Title pt will tell SLP pt utilized relaxation techniques to quell a coughing episode/need to throat clear between 3 sessions   ?  Time 8   08-14-21  ?  Period Weeks   ?  Status Ongoing   ?  Target Date 09/23/21   ?       ?  SLP LONG TERM GOAL #3  ?  Title pt will demo AB with 10 minutes simple conversation in 2 sessions   ?  Time 8     08-19-21  ?  Period Weeks   ?  Status Ongoing   ?  Target Date 09/23/21   ?       ?  SLP LONG TERM GOAL #4  ?  Title pt will indicate  progress in ST by a score > 16 on Communication Participation Survey   ?  Time 8   ?  Period Weeks   ?  Status Ongoing   ?  Target Date 09/23/21  ?  ?  ?  ?Plan - 08/27/21   ?  ?  Clinical Impression Statement [name]  throat clears were 5 in first 5 minutes but decr'd overall after that. Especially when focusing on abdominal breathing (AB). Pt will cont to benefit from skilled ST teaching pt how to use abdominal breathing in conversation, as well as training pt in how to use visual imagery for relaxation of voice tract to reduce frequency and severity of cough/throat clearing, and thus incr pt's quality of life.    ?  ?     ?  ?  Speech Therapy Frequency 2x / week   ?  Duration 8 weeks   ?  Treatment/Interventions Cueing hierarchy;Functional tasks;Patient/family education;Compensatory strategies;Internal/external aids;Environmental controls;SLP instruction and feedback   ?  Potential to Achieve Goals Good   ?  SLP Home Exercise Plan provided today   ?  Consulted and Agree with Plan of Care Patient   ?  ? ? ? ? ? ? ? ? ?Patient will benefit from skilled therapeutic intervention in order to improve the following deficits and impairments:   ?Other voice and resonance disorders ? ? ? ?Problem List ?Patient Active Problem List  ? Diagnosis Date Noted  ? Attention  deficit hyperactivity disorder, predominantly inattentive type 08/06/2021  ? Asthma 05/07/2021  ? Endometriosis 10/12/2020  ? PTSD (post-traumatic stress disorder) 10/12/2020  ? Bipolar disorder ([place]) 07/07/2018  ? Herpes simplex 07/07/2018  ? Indication for care in labor or delivery 01/19/2017  ? Preterm labor 01/10/2017  ? ? ?[place], [place] ?08/27/2021, 4:59 PM ? ?[place] ?[facility] ?[address], [address] ?[place], [place], [address] ?Phone: [phone]   Fax:  [phone] ? ? ?Name: Belem Hintze ?MRN: [id number] ?Date of Birth: Sep 20, 1985 ? ?

## 2021-08-27 NOTE — Progress Notes (Signed)
See procedure note.

## 2021-08-27 NOTE — Procedures (Signed)
? ?  [facility] ? ?HOME SLEEP TEST (Watch PAT) REPORT ? ?STUDY DATE: 08/24/2021 ? ?DOB: [birth year] ? ?MRN: [id number] ? ?ORDERING CLINICIAN: Star Age, MD, PhD ?  ?REFERRING CLINICIAN: Frann Rider, NP ? ?CLINICAL INFORMATION/HISTORY: 36 year old patient presents for reevaluation of sleep apnea with a prior mild sleep apnea diagnosis and interim weight loss reported. ? ? ?BMI: 27.5 kg/m? ? ?FINDINGS:  ? ?Sleep Summary:  ? ?Total Recording Time (hours, min): 7 hours, 58 minutes ? ?Total Sleep Time (hours, min):  7 hours, 10 minutes  ? ?Percent REM (%):    23.7%  ? ?Respiratory Indices:  ? ?Calculated pAHI (per hour):  0.7/hour        ? ?REM pAHI:    2.4/hour      ? ?NREM pAHI: 0.2/hour ? ?Oxygen Saturation Statistics:  ?  ?Oxygen Saturation (%) Mean: 96%  ? ?Minimum oxygen saturation (%):                 93%  ? ?O2 Saturation Range (%): 93-99%   ? ?O2 Saturation (minutes) <=88%: 0 min ? ?Pulse Rate Statistics:  ? ?Pulse Mean (bpm):    69/min   ? ?Pulse Range (55-94/min)  ? ?IMPRESSION: History of obstructive sleep apnea.   ? ?RECOMMENDATION:  ?This home sleep test does not demonstrate any significant obstructive or central sleep disordered breathing.  Treatment with a positive airway pressure device such as CPAP or AutoPap is not indicated.  Other causes of the patient's symptoms, including circadian rhythm disturbances, an underlying mood disorder, medication effect and/or an underlying medical problem cannot be ruled out based on this test. Clinical correlation is recommended. The patient should be cautioned not to drive, work at heights, or operate dangerous or heavy equipment when tired or sleepy. Review and reiteration of good sleep hygiene measures should be pursued with any patient. ?The patient can follow up with primary care and other specialists as scheduled/planned.  ? ?I certify that I have reviewed the raw data recording prior to the issuance of this report in accordance with the  standards of the American Academy of Sleep Medicine (AASM). ? ? ?INTERPRETING PHYSICIAN:  ? ?Star Age, MD, PhD  ?Board Certified in Neurology and Sleep Medicine ? ?[facility] ?[address], [address] ?[place], [place] [address] ?[phone] ? ? ? ? ? ? ? ? ? ? ? ? ? ? ? ? ?

## 2021-08-29 DIAGNOSIS — F3176 Bipolar disorder, in full remission, most recent episode depressed: Secondary | ICD-10-CM | POA: Diagnosis not present

## 2021-08-31 DIAGNOSIS — F319 Bipolar disorder, unspecified: Secondary | ICD-10-CM | POA: Diagnosis not present

## 2021-08-31 DIAGNOSIS — G4730 Sleep apnea, unspecified: Secondary | ICD-10-CM | POA: Diagnosis not present

## 2021-08-31 DIAGNOSIS — Z8659 Personal history of other mental and behavioral disorders: Secondary | ICD-10-CM | POA: Diagnosis not present

## 2021-08-31 DIAGNOSIS — N926 Irregular menstruation, unspecified: Secondary | ICD-10-CM | POA: Diagnosis not present

## 2021-09-01 ENCOUNTER — Ambulatory Visit: Payer: BC Managed Care – PPO | Attending: Internal Medicine

## 2021-09-01 DIAGNOSIS — R498 Other voice and resonance disorders: Secondary | ICD-10-CM | POA: Insufficient documentation

## 2021-09-01 NOTE — Therapy (Signed)
[place] ?[facility] ?[address], [address] ?[place], [place], [address] ?Phone: [phone]   Fax:  [phone] ? ?Speech Language Pathology Treatment ? ?Patient Details  ?Name: Katie [name] ?MRN: [id number] ?Date of Birth: 05/15/1985 ?Referring Provider (SLP): Brand Males, MD ? ? ?Encounter Date: 09/01/2021 ? ? End of Session - 09/01/21 1100   ? ? Visit Number 6   ? Number of Visits 13   ? Date for SLP Re-Evaluation 09/23/21   ? SLP Start Time 1015   ? SLP Stop Time  1048   ? SLP Time Calculation (min) 33 min   ? Activity Tolerance Patient tolerated treatment well   ? ?  ?  ? ?  ? ? ?Past Medical History:  ?Diagnosis Date  ? Anxiety   ? Bipolar 1 disorder ([place])   ? Depression   ? Excessive daytime sleepiness   ? Fibroid   ? Genital herpes   ? ? ?Past Surgical History:  ?Procedure Laterality Date  ? NO PAST SURGERIES    ? ? ?There were no vitals filed for this visit. ? ? Subjective Assessment - 09/01/21 1020   ? ? Subjective "I feel like I'm coming down with something. It happens after - stress and fatigue."   ? Currently in Pain? No/denies   ? ?  ?  ? ?  ? ? ? ? ? ? ? ? ADULT SLP TREATMENT - 09/01/21 1021   ? ?  ? General Information  ? Behavior/Cognition Alert;Cooperative;Pleasant mood   ?  ? Treatment Provided  ? Treatment provided Cognitive-Linquistic   ?  ? Cognitive-Linquistic Treatment  ? Treatment focused on Voice   ? Skilled Treatment "This leads to the same set of cold sx which lead into the cough that I'm here for." SLP reviewed s/sx VCD and Katie [name] stated that relaxation and swallow techniques have been used when the s/sx VCD occur and that these have been successful in the past. Abdomoinal Breathing (AB) in 20 minutes mod complex conversation today was 75-80% frequency. No throat clears today in 30 minutes!   ?  ? Assessment / Recommendations / Plan  ? Plan Continue with current plan of care   possible decr to every other week next visit  ?  ? Progression Toward Goals   ? Progression toward goals Progressing toward goals   ? ?  ?  ? ?  ? ?SLP Short Term Goals - 09/01/21    ?  ?       ?       ?  SLP SHORT TERM GOAL #1  ?  Title pt will use AB in short answers (4-7 words) 80% of the time in 2 sessions   ?  Time 4   08-19-21  ?  Period Weeks   ?  Status Met   ?  Target Date 08/28/21   ?       ?  SLP SHORT TERM GOAL #2  ?  Title pt will use at least one relaxation strategy when pt feels VCD triggered between 3 sessions   ?  Time 4    08-14-21  ?  Period Weeks   ?  Status Partially met   ?  Target Date 08/28/21   ?       ?  SLP SHORT TERM GOAL #3  ?  Title pt will tell SLP 3 relaxation techniques he can use to mitigate s/sx VCD in 3 sessions   ?  Time 4    08-14-21, 08-27-21  ?  Period Weeks   ?  Status Partially met   ?  Target Date 08/28/21   ?  ?   ?  ?  ?   ?  ?  ?  SLP Long Term Goals - 09/01/21    ?  ?       ?       ?  SLP LONG TERM GOAL #1  ?  Title pt will exhibit no more than 3 throat clears and/or coughs in 3 sessions   ?  Time 8     08-19-21, 09-01-21  ?  Period Weeks   ?  Status Ongoing   ?  Target Date 09/23/21   ?       ?  SLP LONG TERM GOAL #2  ?  Title pt will tell SLP pt utilized relaxation techniques to quell a coughing episode/need to throat clear between 3 sessions   ?  Time 8   08-14-21, 09-01-21  ?  Period Weeks   ?  Status Ongoing   ?  Target Date 09/23/21   ?       ?  SLP LONG TERM GOAL #3  ?  Title pt will demo AB with 10 minutes simple conversation in 3 sessions   ?  Time 8     08-19-21  ?  Period Weeks   ?  Status Revised  ?  Target Date 09/23/21   ?       ?  SLP LONG TERM GOAL #4  ?  Title pt will indicate progress in ST by a score > 16 on Communication Participation Survey   ?  Time 8   ?  Period Weeks   ?  Status Ongoing   ?  Target Date 09/23/21  ?  ?  ?  ?Plan - 09/01/21   ?  ?  Clinical Impression Statement Katie [name]'s throat clears were none in 30 minutes, and frequency of AB in conversation was 75-80%. Pt will cont to benefit from skilled ST teaching pt how to use  abdominal breathing in conversation, as well as training pt in how to use visual imagery for relaxation of voice tract to reduce frequency and severity of cough/throat clearing, and thus incr pt's quality of life.    ? ? ? ? ? ? ? ? ? ?Patient will benefit from skilled therapeutic intervention in order to improve the following deficits and impairments:   ?Other voice and resonance disorders ? ? ? ?Problem List ?Patient Active Problem List  ? Diagnosis Date Noted  ? Attention deficit hyperactivity disorder, predominantly inattentive type 08/06/2021  ? Asthma 05/07/2021  ? Endometriosis 10/12/2020  ? PTSD (post-traumatic stress disorder) 10/12/2020  ? Bipolar disorder ([place]) 07/07/2018  ? Herpes simplex 07/07/2018  ? Indication for care in labor or delivery 01/19/2017  ? Preterm labor 01/10/2017  ? ? ?[place], [place] ?09/01/2021, 11:03 AM ? ?[place] ?[place] [facility] ?[address], [address] ?[place], [place], [address] ?Phone: [phone]   Fax:  [phone] ? ? ?Name: Katie [name] ?MRN: [id number] ?Date of Birth: 08-Feb-1986 ? ?

## 2021-09-02 DIAGNOSIS — J069 Acute upper respiratory infection, unspecified: Secondary | ICD-10-CM | POA: Diagnosis not present

## 2021-09-02 DIAGNOSIS — R053 Chronic cough: Secondary | ICD-10-CM | POA: Diagnosis not present

## 2021-09-02 DIAGNOSIS — J309 Allergic rhinitis, unspecified: Secondary | ICD-10-CM | POA: Diagnosis not present

## 2021-09-02 DIAGNOSIS — J019 Acute sinusitis, unspecified: Secondary | ICD-10-CM | POA: Diagnosis not present

## 2021-09-03 DIAGNOSIS — F3176 Bipolar disorder, in full remission, most recent episode depressed: Secondary | ICD-10-CM | POA: Diagnosis not present

## 2021-09-07 ENCOUNTER — Ambulatory Visit: Payer: BC Managed Care – PPO

## 2021-09-07 DIAGNOSIS — R498 Other voice and resonance disorders: Secondary | ICD-10-CM | POA: Diagnosis not present

## 2021-09-07 NOTE — Therapy (Signed)
[place] ?[facility] ?[address] ?[place], [place], [address] ?Phone: [phone]   Fax:  [phone] ? ?Speech Language Pathology Treatment ? ?Patient Details  ?Name: Katie [name] ?MRN: [id number] ?Date of Birth: 05-Aug-1985 ?Referring Provider (SLP): Brand Males, MD ? ? ?Encounter Date: 09/07/2021 ? ? End of Session - 09/07/21 1222   ? ? Visit Number 7   ? Number of Visits 13   ? Date for SLP Re-Evaluation 09/23/21   ? SLP Start Time 0932   ? SLP Stop Time  1015   ? SLP Time Calculation (min) 43 min   ? Activity Tolerance Patient tolerated treatment well   ? ?  ?  ? ?  ? ? ?Past Medical History:  ?Diagnosis Date  ? Anxiety   ? Bipolar 1 disorder ([place])   ? Depression   ? Excessive daytime sleepiness   ? Fibroid   ? Genital herpes   ? ? ?Past Surgical History:  ?Procedure Laterality Date  ? NO PAST SURGERIES    ? ? ?There were no vitals filed for this visit. ? ? Subjective Assessment - 09/07/21 0938   ? ? Subjective "The cough was a lot briefer and a lot less severe than it normally is (with the cold)."   ? Currently in Pain? No/denies   ? ?  ?  ? ?  ? ? ? ? ? ? ? ? ADULT SLP TREATMENT - 09/07/21 0943   ? ?  ? General Information  ? Behavior/Cognition Alert;Cooperative;Pleasant mood   ?  ? Treatment Provided  ? Treatment provided Cognitive-Linquistic   ?  ? Cognitive-Linquistic Treatment  ? Treatment focused on Voice   ? Skilled Treatment Pt stated the cold begun last session was now on "the tail end". Severity of the VCD sx were rated 2/10, down from 4-5/10 in her last 2 cold episodes, down from 5-7/10 for previous two before that. No throat clears heard today. Pt practiced relaxation techniques for VCD since last session. [name] acknowledges the sx of VCD are less now than prior to initiation of ST. [name] cont to endorse stress and high emotions as a trigger for VCD sx. [name] agreed she has good plan/s in place to look at any stress with her counselor and cont to work  through these feelings. Pt described "feeling like a rubber band is pulling that "y" going down into my lungs" in the last 4-5 days - pt pointed to the chest when saying this. Also, coughing (non-productive) reported during the night. SLP suggested pt check with her PCP about the need for GI referral as this sounded like it mey be GERD.   ?  ? Assessment / Recommendations / Plan  ? Plan Other (Comment)   decr to once every other ewek  ?  ? Progression Toward Goals  ? Progression toward goals Progressing toward goals   ? ?  ?  ? ?  ? ? ? SLP Education - 09/07/21 1222   ? ? Education Details see "skilled intervention"   ? Person(s) Educated Patient   ? Methods Explanation   ? Comprehension Verbalized understanding   ? ?  ?  ? ?  ? ? ? ? ? ?SLP Short Term Goals - 09/01/21    ?  ?       ?       ?  SLP SHORT TERM GOAL #1  ?  Title pt will use AB in short answers (4-7 words)  80% of the time in 2 sessions   ?  Time 4   08-19-21  ?  Period Weeks   ?  Status Met   ?  Target Date 08/28/21   ?       ?  SLP SHORT TERM GOAL #2  ?  Title pt will use at least one relaxation strategy when pt feels VCD triggered between 3 sessions   ?  Time 4    08-14-21  ?  Period Weeks   ?  Status Partially met   ?  Target Date 08/28/21   ?       ?  SLP SHORT TERM GOAL #3  ?  Title pt will tell SLP 3 relaxation techniques he can use to mitigate s/sx VCD in 3 sessions   ?  Time 4    08-14-21, 08-27-21  ?  Period Weeks   ?  Status Partially met   ?  Target Date 08/28/21   ?  ?   ?  ?  ?   ?  ?  ?  SLP Long Term Goals - 09/01/21    ?  ?       ?       ?  SLP LONG TERM GOAL #1  ?  Title pt will exhibit no more than 3 throat clears and/or coughs in 3 sessions   ?  Time 8     08-19-21, 09-01-21  ?  Period Weeks   ?  Status Met   ?  Target Date 09/23/21   ?       ?  SLP LONG TERM GOAL #2  ?  Title pt will tell SLP pt utilized relaxation techniques to quell a coughing episode/need to throat clear between 3 sessions   ?  Time 8   08-14-21, 09-01-21  ?  Period Weeks    ?  Status Met   ?  Target Date 09/23/21   ?       ?  SLP LONG TERM GOAL #3  ?  Title pt will demo AB with 10 minutes simple conversation in 3 sessions   ?  Time 8     08-19-21  ?  Period Weeks   ?  Status Revised  ?  Target Date 09/23/21   ?       ?  SLP LONG TERM GOAL #4  ?  Title pt will indicate progress in ST by a score > 16 on Communication Participation Survey   ?  Time 8   ?  Period Weeks   ?  Status Ongoing   ?  Target Date 09/23/21  ?  ?  ?  ?Plan - 09/01/21   ?  ?  Clinical Impression Statement [name]'s throat clears were none in 30 minutes, see "skilled intervention" for more details on today's session. Pt agreed to decr frequency to once/week due to progress. Pt will cont to benefit from skilled ST teaching pt how to use abdominal breathing in conversation, as well as training pt in how to use visual imagery for relaxation of voice tract to reduce frequency and severity of cough/throat clearing, and thus incr pt's quality of life.    ? ? ? ? ?Patient will benefit from skilled therapeutic intervention in order to improve the following deficits and impairments:   ?Other voice and resonance disorders ? ? ? ?Problem List ?Patient Active Problem List  ? Diagnosis Date Noted  ? Attention deficit hyperactivity disorder, predominantly inattentive  type 08/06/2021  ? Asthma 05/07/2021  ? Endometriosis 10/12/2020  ? PTSD (post-traumatic stress disorder) 10/12/2020  ? Bipolar disorder ([place]) 07/07/2018  ? Herpes simplex 07/07/2018  ? Indication for care in labor or delivery 01/19/2017  ? Preterm labor 01/10/2017  ? ? ?[place], [place] ?09/07/2021, 12:23 PM ? ?[place] ?[facility] ?[address], [address] ?[place], [place], [address] ?Phone: [phone]   Fax:  [phone] ? ? ?Name: Amilee Janvier ?MRN: [id number] ?Date of Birth: 1986-02-16 ? ?

## 2021-09-14 DIAGNOSIS — F3176 Bipolar disorder, in full remission, most recent episode depressed: Secondary | ICD-10-CM | POA: Diagnosis not present

## 2021-09-18 DIAGNOSIS — F3176 Bipolar disorder, in full remission, most recent episode depressed: Secondary | ICD-10-CM | POA: Diagnosis not present

## 2021-09-21 ENCOUNTER — Ambulatory Visit: Payer: BC Managed Care – PPO

## 2021-09-21 DIAGNOSIS — R498 Other voice and resonance disorders: Secondary | ICD-10-CM

## 2021-09-21 NOTE — Therapy (Signed)
[facility] [address] [place], [place], [address] Phone: [phone]   Fax:  [phone]  Speech Language Pathology Treatment  Patient Details  Name: Katie [name] MRN: [id number] Date of Birth: [birth year] Referring Provider (SLP): Brand Males, MD   Encounter Date: 09/21/2021   End of Session - 09/21/21 0959     Visit Number 8    Number of Visits 13    Date for SLP Re-Evaluation 09/23/21    SLP Start Time 0938   6 minutes late   SLP Stop Time  1016    SLP Time Calculation (min) 38 min    Activity Tolerance Patient tolerated treatment well             Past Medical History:  Diagnosis Date   Anxiety    Bipolar 1 disorder ([place])    Depression    Excessive daytime sleepiness    Fibroid    Genital herpes     Past Surgical History:  Procedure Laterality Date   NO PAST SURGERIES      There were no vitals filed for this visit.   Subjective Assessment - 09/21/21 0943     Subjective "I expected them (s/sx VCD) to be worse given the stress but they haven't been."    Currently in Pain? No/denies                   ADULT SLP TREATMENT - 09/21/21 0944       General Information   Behavior/Cognition Alert;Cooperative;Pleasant mood      Treatment Provided   Treatment provided Cognitive-Linquistic      Cognitive-Linquistic Treatment   Treatment focused on Voice    Skilled Treatment [name] reports it has been good with VCD s/sx even though she has been stressed - attributes this partially to greater focus on abdominal breathing (AB). In 25 minutes of conversation SLP viewed pt's abdomial musculature x8 with each time confirming AB. [name] told SLP when questioned that reduction of VCD sx "has been at least 70%- maybe a little more". [name] agrees remaining sx likely mostly due to stress and other emotions so SLP suggested some compensations for this and encouraged an ongoing dialogue with the counselor. [name]  agreed and will cont to do these behaviors decreasing/eliminating VCD sx due to physiologic factors.      Assessment / Recommendations / Plan   Plan Continue with current plan of care      Progression Toward Goals   Progression toward goals Progressing toward goals              SLP Education - 09/21/21 1326     Education Details see pt note this date    Person(s) Educated Patient    Methods Explanation    Comprehension Verbalized understanding              SLP Short Term Goals - 09/21/21                      SLP SHORT TERM GOAL #1    Title pt will use AB in short answers (4-7 words) 80% of the time in 2 sessions     Time 4   08-19-21    Period Weeks     Status Met     Target Date 08/28/21            SLP SHORT TERM GOAL #2    Title pt will use at least one  relaxation strategy when pt feels VCD triggered between 3 sessions     Time 4    08-14-21    Period Weeks     Status Partially met     Target Date 08/28/21            SLP SHORT TERM GOAL #3    Title pt will tell SLP 3 relaxation techniques he can use to mitigate s/sx VCD in 3 sessions     Time 4    08-14-21, 08-27-21    Period Weeks     Status Partially met     Target Date 08/28/21                     SLP Long Term Goals - 09/21/21                      SLP LONG TERM GOAL #1    Title pt will exhibit no more than 3 throat clears and/or coughs in 3 sessions     Time 8     08-19-21, 09-01-21    Period Weeks     Status Met     Target Date 09/23/21            SLP LONG TERM GOAL #2    Title pt will tell SLP pt utilized relaxation techniques to quell a coughing episode/need to throat clear between 3 sessions     Time 8   08-14-21, 09-01-21    Period Weeks     Status Met     Target Date 09/23/21            SLP LONG TERM GOAL #3    Title pt will demo AB with 10 minutes simple conversation in 3 sessions     Time 8     08-19-21, 09-21-21    Period Weeks     Status Revised    Target Date 09/23/21             SLP LONG TERM GOAL #4    Title pt will indicate progress in ST by a score > 16 on Communication Participation Survey     Time 8     Period Weeks     Status Ongoing     Target Date 09/23/21        Plan - 09/21/21       Clinical Impression Statement [name]'s throat clears were none in 30 minutes conversation, see "skilled intervention" for more details on today's session. Pt agreed to decr frequency to once/week due to progress. Pt will cont to benefit from skilled ST teaching pt how to use abdominal breathing in conversation, as well as training pt in how to use visual imagery for relaxation of voice tract to reduce frequency and severity of cough/throat clearing, and thus incr pt's quality of life. Suspect discharge in 1-2 more visits.             Speech Therapy Frequency Once every other week    Duration 8 weeks    Treatment/Interventions Cueing hierarchy;Functional tasks;Patient/family education;Compensatory strategies;Internal/external aids;Environmental controls;SLP instruction and feedback    Potential to [place] provided today    Consulted and Agree with Plan of Care Patient             Patient will benefit from skilled therapeutic intervention in order to improve the following deficits and impairments:   Other voice and resonance disorders  Problem List Patient Active Problem List   Diagnosis Date Noted   Attention deficit hyperactivity disorder, predominantly inattentive type 08/06/2021   Asthma 05/07/2021   Endometriosis 10/12/2020   PTSD (post-traumatic stress disorder) 10/12/2020   Bipolar disorder ([place]) 07/07/2018   Herpes simplex 07/07/2018   Indication for care in labor or delivery 01/19/2017   Preterm labor 01/10/2017    [facility], [place] 09/21/2021, 1:30 PM  [facility] [address] [place], [place], [address] Phone: [phone]   Fax:   [phone]   Name: Katie [name] MRN: [id number] Date of Birth: 1986/02/12

## 2021-09-21 NOTE — Patient Instructions (Signed)
   Continue to use those compensations helpful for reducing/eliminating VCD symptoms!  Continue to use abdominal breathing - you are doing very well with this.  Continue to talk with your therapist about stressors and using techniques which mitigate/deal with these feelings.

## 2021-09-22 DIAGNOSIS — F649 Gender identity disorder, unspecified: Secondary | ICD-10-CM | POA: Diagnosis not present

## 2021-09-22 DIAGNOSIS — G4700 Insomnia, unspecified: Secondary | ICD-10-CM | POA: Diagnosis not present

## 2021-09-22 DIAGNOSIS — Z8659 Personal history of other mental and behavioral disorders: Secondary | ICD-10-CM | POA: Diagnosis not present

## 2021-09-22 DIAGNOSIS — J385 Laryngeal spasm: Secondary | ICD-10-CM | POA: Diagnosis not present

## 2021-09-22 DIAGNOSIS — F319 Bipolar disorder, unspecified: Secondary | ICD-10-CM | POA: Diagnosis not present

## 2021-09-25 DIAGNOSIS — F3176 Bipolar disorder, in full remission, most recent episode depressed: Secondary | ICD-10-CM | POA: Diagnosis not present

## 2021-10-01 ENCOUNTER — Encounter: Payer: Self-pay | Admitting: Primary Care

## 2021-10-01 ENCOUNTER — Ambulatory Visit (INDEPENDENT_AMBULATORY_CARE_PROVIDER_SITE_OTHER): Payer: BC Managed Care – PPO | Admitting: Internal Medicine

## 2021-10-01 ENCOUNTER — Ambulatory Visit: Payer: BC Managed Care – PPO | Admitting: Primary Care

## 2021-10-01 VITALS — BP 114/62 | HR 79 | Temp 98.7°F | Ht 63.5 in | Wt 135.4 lb

## 2021-10-01 DIAGNOSIS — J387 Other diseases of larynx: Secondary | ICD-10-CM | POA: Diagnosis not present

## 2021-10-01 DIAGNOSIS — R053 Chronic cough: Secondary | ICD-10-CM

## 2021-10-01 DIAGNOSIS — G4719 Other hypersomnia: Secondary | ICD-10-CM | POA: Diagnosis not present

## 2021-10-01 DIAGNOSIS — J329 Chronic sinusitis, unspecified: Secondary | ICD-10-CM

## 2021-10-01 LAB — PULMONARY FUNCTION TEST
DL/VA % pred: 156 %
DL/VA: 7.03 ml/min/mmHg/L
DLCO cor % pred: 122 %
DLCO cor: 26.69 ml/min/mmHg
DLCO unc % pred: 122 %
DLCO unc: 26.69 ml/min/mmHg
FEF 25-75 Post: 3.96 L/sec
FEF 25-75 Pre: 3.53 L/sec
FEF2575-%Change-Post: 12 %
FEF2575-%Pred-Post: 135 %
FEF2575-%Pred-Pre: 121 %
FEV1-%Change-Post: 4 %
FEV1-%Pred-Post: 105 %
FEV1-%Pred-Pre: 100 %
FEV1-Post: 2.69 L
FEV1-Pre: 2.57 L
FEV1FVC-%Change-Post: -5 %
FEV1FVC-%Pred-Pre: 110 %
FEV6-%Change-Post: 12 %
FEV6-%Pred-Post: 101 %
FEV6-%Pred-Pre: 90 %
FEV6-Post: 3.06 L
FEV6-Pre: 2.72 L
FEV6FVC-%Pred-Post: 101 %
FEV6FVC-%Pred-Pre: 101 %
FVC-%Change-Post: 11 %
FVC-%Pred-Post: 99 %
FVC-%Pred-Pre: 89 %
FVC-Post: 3.06 L
FVC-Pre: 2.75 L
Post FEV1/FVC ratio: 88 %
Post FEV6/FVC ratio: 100 %
Pre FEV1/FVC ratio: 93 %
Pre FEV6/FVC Ratio: 100 %
RV % pred: 109 %
RV: 1.64 L
TLC % pred: 82 %
TLC: 4.13 L

## 2021-10-01 LAB — POCT EXHALED NITRIC OXIDE: FeNO level (ppb): 16

## 2021-10-01 NOTE — Assessment & Plan Note (Addendum)
-   Attending neuro/vocal rehab.  Continue omeprazole 20 mg twice daily

## 2021-10-01 NOTE — Progress Notes (Signed)
$'@Patient'F$  ID: Katie [name], adult    DOB: [birth year], 36 y.o.   MRN: [id number]  Chief Complaint  Patient presents with   Follow-up    Pt is here for follow up with cough. Pt states cough is doing a lot better. Pt states sh is doing the Atrovent nasal spray and Claritin for her allergies and Prilosec for reflux. She states that she is not taking the Qvar inhaler anymore. Wants to go over HOST done in April     Referring provider: Fanny Bien, MD  HPI: 36 year old transgender female, legal sex is female.  Medical history significant for asthma, ADHD, endometriosis, PTSD, bipolar disorder.  Patient Dr. Chase [name], seen for initial consult on 07/02/2021 for chronic cough.  Previous [facility] pulmonary encounter: 07/02/2021 -       Chief Complaint  Patient presents with   Consult      Pt states he has had a cough for the past 8 months. States the cough is worse at night but is also steady throughout the day. Will have some occasional wheezing. States the symptoms will come and go.    HPI Katie [name] 36 y.o. -new consult for chronic cough. Katie [name] - paddresss l In female gender "He". he tells me that he has had chronic cough since June 2022.  It is episodic.  He will have an episode the last 2 weeks every 2 months.  The cough can be quite violent and by the end of 2 weeks he gets fatigued and then has to spend a day or 2 sleeping and recovering and then he is fine till couple months later the cough..  The cough is present all day and all night.  Talking makes it worse.  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  Drinking water does make it better.  However overall no relief.  He believes he is treatment resistant.  Has not responded to antibiotics or over-the-counter medications and even prescription cough medications.  Qvar might help but only a little bit.  There is occasional wheezing.  There is also occasional shortness of breath.  Cough relevant  history - Had childhood asthma but grew out of it. - Has visited [place] [facility] and according to his history 2 months ago skin test was negative. - Does admit to postnasal drip.  Some CT [name] in the fall 2022 showed sinus thickening according to history - Is taking PPI for possible GERD - Does clear the throat. -Also has frequent menstruation.  There is concern for endometriosis.  Is awaiting hysterectomy .  There is associated fatigue.   - is on lot of osych meds    08/06/2021 We were able to connect momentarily to video visit, he had service issues and video ended. We finished out visit by way of televisit. Patient has had a cough for 1 week with associated's rhinitis. His symptoms frequently come and go. This is the fourth time he has had bronchitis since June 2022. He states that stress will induce cough. He is currently participating in vocal rehab. He had frequent throat clearing. Associated sinus pressure and drainage. He is taking Qvar on regular basis, but states that when he is sick it actually makes his cough worse. He is also taking loratidine '10mg'$  daily and omeprazole '20mg'$  once daily. He ha not started nasal sprays that were prescribed by his PCP but plans to. He is currently also on abx/prednisone. HRCT in March 2023  showed no evidence of interstitial lung disease and CT maxillary sinuses without substantial apparent nasal sinus disease.     Chronic cough: - PND and GERD contributing. Needs PFTs to assess for obstructive lung disease  - High resolution CT chest without ILD or lung abnormality   Irritable larynx: - Referred to neuro rehab - Plan increase omeprazole '20mg'$  to twice daily    PND/chronic sinusitis:  - CT maxillofacial showed mild mucosal thickening in the inferior maxillary sinuses  - Start Astelin and Ipratropium nasal spray as directed  - Continue loratadine '10mg'$  daily  - Refer to ENT    Childhood asthma:  - Continue Qvar 32mg for the time being, may  discontinue to observe cough off ICS inhaler - Needs to reschedule PFTs and FENO to assess lung function   10/01/2021 Patient presents today for 4 to 8-week follow-up with PFTs.  Patient is doing well today without acute complaints. Cough has resolved since being on daily antihistamine, PPI and nasal sprays.  We stopped QVAR at last visit and cough has not returned. She has no associated shortness of breath.  Patient had home sleep study in April 2023 that showed no evidence of obstructive sleep apnea.  She reports daytime fatigue.  She takes Adderall 10 mg extended release daily but will occasionally try to go without taking it on the weekends.  She gets tired around 3 PM every afternoon.  She is on several psych medications for history of bipolar disease.  [facility]  Currently tapering off Topamax.   Pulmonary function testing 10/01/2021-FVC 3.06 (99%), FEV1 2.69 (105%), ratio 88, TLC 82%, DLCO 26.69 (122%)  Allergies  Allergen Reactions   Nitrofurantoin Nausea And Vomiting and Other (See Comments)    Other reaction(s): Abdominal Pain, GI Intolerance    Immunization History  Administered Date(s) Administered   Influenza,inj,Quad PF,6+ Mos 01/13/2017, 01/31/2021   PFIZER(Purple Top)SARS-COV-2 Vaccination 03/18/2021   Unspecified SARS-COV-2 Vaccination 08/10/2019, 09/03/2019, 04/03/2020    Past Medical History:  Diagnosis Date   Anxiety    Bipolar 1 disorder ([place]    Depression    Excessive daytime sleepiness    Fibroid    Genital herpes     Tobacco History: Social History   Tobacco Use  Smoking Status Never  Smokeless Tobacco Never   Counseling given: Not Answered   Outpatient Medications Prior to Visit  Medication Sig Dispense Refill   acyclovir (ZOVIRAX) 400 MG tablet Take 400 mg by mouth daily.     albuterol (VENTOLIN HFA) 108 (90 Base) MCG/ACT inhaler Inhale 2 puffs into the lungs every 4 (four) hours as needed.      amphetamine-dextroamphetamine (ADDERALL XR) 10 MG 24 hr capsule Take 10 mg by mouth daily.     Cholecalciferol (VITAMIN D3) 1.25 MG (50000 UT) CAPS Take by mouth.     ipratropium (ATROVENT) 0.03 % nasal spray Place into both nostrils.     lamoTRIgine (LAMICTAL) 100 MG tablet Take 200 mg by mouth daily.     levonorgestrel (MIRENA, 52 MG,) 20 MCG/DAY IUD Mirena 21 mcg/24 hours (8 yrs) 52 mg intrauterine device     lithium carbonate 300 MG capsule Take 900 mg by mouth daily.     loratadine (CLARITIN) 10 MG tablet 10 mg daily.     LORazepam (ATIVAN) 0.5 MG tablet Take 0.25 mg by mouth at bedtime. Divide 0.5 mg in half     omeprazole (PRILOSEC) 20 MG capsule Take 20 mg by mouth every morning.  ORILISSA 150 MG TABS Take 1 tablet by mouth daily.     Prenatal Vit-Fe Fumarate-FA (PRENATAL MULTIVITAMIN) TABS tablet Take 1 tablet by mouth at bedtime.     QUEtiapine (SEROQUEL) 25 MG tablet Take 25 mg by mouth at bedtime.     topiramate (TOPAMAX) 50 MG tablet Take 50 mg by mouth 2 (two) times daily.     valACYclovir (VALTREX) 500 MG tablet Take 500 mg by mouth daily.     QVAR REDIHALER 40 MCG/ACT inhaler SMARTSIG:1-2 Puff(s) Via Inhaler Twice Daily (Patient not taking: Reported on 10/01/2021)     ALPRAZolam (XANAX) 0.25 MG tablet Take 0.25 mg by mouth daily. (Patient not taking: Reported on 09/01/2021)     amoxicillin-clavulanate (AUGMENTIN XR) 1000-62.5 MG 12 hr tablet Take 2 tablets by mouth 2 (two) times daily. (Patient not taking: Reported on 08/19/2021)     azelastine (ASTELIN) 0.1 % nasal spray azelastine 137 mcg (0.1 %) nasal spray aerosol     No facility-administered medications prior to visit.      Review of Systems  Review of Systems   Physical Exam  BP 114/62 (BP Location: Right Arm, Patient Position: Sitting, Cuff Size: Normal)   Pulse 79   Temp 98.7 F (37.1 C) (Oral)   Ht 5' 3.5" (1.613 m)   Wt 135 lb 6.4 oz (61.4 kg)   SpO2 98%   BMI 23.61 kg/m  Physical Exam   Lab  Results:  CBC    Component Value Date/Time   WBC 13.8 (H) 01/08/2021 2248   RBC 4.27 01/08/2021 2248   HGB 13.6 01/08/2021 2248   HCT 43.0 01/08/2021 2248   PLT 361 01/08/2021 2248   MCV 100.7 (H) 01/08/2021 2248   MCH 31.9 01/08/2021 2248   MCHC 31.6 01/08/2021 2248   RDW 11.9 01/08/2021 2248   LYMPHSABS 2.7 01/08/2021 2248   MONOABS 1.0 01/08/2021 2248   EOSABS 0.1 01/08/2021 2248   BASOSABS 0.0 01/08/2021 2248    BMET    Component Value Date/Time   NA 140 01/08/2021 2248   K 3.6 01/08/2021 2248   CL 112 (H) 01/08/2021 2248   CO2 24 01/08/2021 2248   GLUCOSE 135 (H) 01/08/2021 2248   BUN 7 01/08/2021 2248   CREATININE 0.81 01/08/2021 2248   CREATININE 0.74 11/20/2015 0841   CALCIUM 9.1 01/08/2021 2248   GFRNONAA >60 01/08/2021 2248   GFRAA >60 10/04/2016 1243    BNP No results found for: BNP  ProBNP No results found for: PROBNP  Imaging: No results found.   Assessment & Plan:   Chronic cough - Cough has resolved since last office visit on Atrovent nasal spray, Claritin and Prilosec.  Cough felt to be due to underlying postnasal drip and reflux.  She is no longer taking Qvar.  Pulmonary function testing today showed normal lung function. FENO was 19.  Patient to contact office if cough returns.  Follow-up in 3 months with Dr. Chase [name] or sooner if needed.  Excessive daytime sleepiness - Patient has symptoms of persistent daytime fatigue.  She gets tired every day around 3 PM.  No symptoms of narcolepsy.  Patient has history of bipolar disorder.  Follows with psychiatry.  She is on several medications including Lamictal, Adderall, Seroquel and Topamax.  She reports restless sleep.  Home sleep study in April 2023 showed no evidence of obstructive sleep apnea, REM sleep accounted for 23% of total sleep time which is normal.  Recommend tapering off Topamax as this can interrupt sleep  pattern/worsen insomnia symptoms.  Would also recommend increasing Adderall to  either 15 or 20 mg extended release under guidance of her psychiatrist for hypersomnia.   Irritable larynx - Attending neuro/vocal rehab.  Continue omeprazole 20 mg twice daily  Sinusitis, chronic - CT sinuses showed mild mucosal thickening in the inferior maxillary sinuses - Continue Astelin and ipratropium nasal spray as directed - Continue loratadine 10 mg daily     Martyn Ehrich, NP 10/01/2021

## 2021-10-01 NOTE — Assessment & Plan Note (Addendum)
-   Cough has resolved since last office visit on Atrovent nasal spray, Claritin and Prilosec.  Cough felt to be due to underlying postnasal drip and reflux.  She is no longer taking Qvar.  Pulmonary function testing today showed normal lung function. FENO was 16.  Patient to contact office if cough returns.  Follow-up in 3 months with Dr. Chase Caller or sooner if needed.

## 2021-10-01 NOTE — Assessment & Plan Note (Signed)
-   Patient has symptoms of persistent daytime fatigue.  She gets tired every day around 3 PM.  No symptoms of narcolepsy.  Patient has history of bipolar disorder.  Follows with psychiatry.  She is on several medications including Lamictal, Adderall, Seroquel and Topamax.  She reports restless sleep.  Home sleep study in April 2023 showed no evidence of obstructive sleep apnea, REM sleep accounted for 23% of total sleep time which is normal.  Recommend tapering off Topamax as this can interrupt sleep pattern/worsen insomnia symptoms.  Would also recommend increasing Adderall to either 15 or 20 mg extended release under guidance of her psychiatrist for hypersomnia.

## 2021-10-01 NOTE — Progress Notes (Signed)
Full PFT Performed Today  

## 2021-10-01 NOTE — Patient Instructions (Signed)
Full PFT Performed Today  

## 2021-10-01 NOTE — Assessment & Plan Note (Signed)
-   CT sinuses showed mild mucosal thickening in the inferior maxillary sinuses - Continue Astelin and ipratropium nasal spray as directed - Continue loratadine 10 mg daily

## 2021-10-01 NOTE — Patient Instructions (Addendum)
-   Home sleep study showed no evidence of obstructive sleep apnea.  Percentage REM sleep time was 23.7%, which is normal.  Focus on side sleeping position or elevate head of bed 30 degrees to help with snoring/occasional apneas - Would recommend going up on Adderall to either 15 or 20 mg extended release for hyper somnolence with guidance from your psychiatrist - Agree with tapering you off of Topamax as this can impact your sleep/insomnia - Breathing test today showed normal lung function, would recommend you stay off of Qvar for now Continue omeprazole 20 mg twice daily, nasal sprays as directed and Claritin for cough.    Follow-up - 3 months with Dr. Chase Caller or APP if cough returns

## 2021-10-02 DIAGNOSIS — F3176 Bipolar disorder, in full remission, most recent episode depressed: Secondary | ICD-10-CM | POA: Diagnosis not present

## 2021-10-07 DIAGNOSIS — Z8659 Personal history of other mental and behavioral disorders: Secondary | ICD-10-CM | POA: Diagnosis not present

## 2021-10-07 DIAGNOSIS — F319 Bipolar disorder, unspecified: Secondary | ICD-10-CM | POA: Diagnosis not present

## 2021-10-07 DIAGNOSIS — F649 Gender identity disorder, unspecified: Secondary | ICD-10-CM | POA: Diagnosis not present

## 2021-10-08 DIAGNOSIS — Z23 Encounter for immunization: Secondary | ICD-10-CM | POA: Diagnosis not present

## 2021-10-09 DIAGNOSIS — F3176 Bipolar disorder, in full remission, most recent episode depressed: Secondary | ICD-10-CM | POA: Diagnosis not present

## 2021-10-12 ENCOUNTER — Ambulatory Visit: Payer: BC Managed Care – PPO | Attending: Internal Medicine

## 2021-10-12 DIAGNOSIS — R498 Other voice and resonance disorders: Secondary | ICD-10-CM | POA: Insufficient documentation

## 2021-10-12 NOTE — Patient Instructions (Signed)
Focus on STOP and BREATH in the next two weeks, when you feel yourself speaking on residual volume! It will get better the more you notice and practice this technique.

## 2021-10-12 NOTE — Therapy (Signed)
[facility] [address], [place], [place], [address] Phone: [phone]   Fax:  [phone]  Speech Language Pathology Treatment/Recertification  Patient Details  Name: Katie [name] MRN: [id number] Date of Birth: [birth year] Referring Provider (SLP): Brand Males, MD   Encounter Date: 10/12/2021   End of Session - 10/12/21 1404     Visit Number 9    Number of Visits 13    Date for SLP Re-Evaluation 11/13/21    SLP Start Time 0935    SLP Stop Time  1165    SLP Time Calculation (min) 40 min    Activity Tolerance Patient tolerated treatment well             Past Medical History:  Diagnosis Date   Anxiety    Bipolar 1 disorder ([place])    Depression    Excessive daytime sleepiness    Fibroid    Genital herpes     Past Surgical History:  Procedure Laterality Date   NO PAST SURGERIES      There were no vitals filed for this visit.   Subjective Assessment - 10/12/21 0957     Subjective In readings (poetic) or occasionally in conversation pt feels like pt is running out of breath and this forces pt to tense up to finish last few words, which exacerbates sx VCD.    Currently in Pain? No/denies                   ADULT SLP TREATMENT - 10/12/21 0959       General Information   Behavior/Cognition Alert;Cooperative;Pleasant mood      Treatment Provided   Treatment provided Cognitive-Linquistic      Cognitive-Linquistic Treatment   Treatment focused on Voice    Skilled Treatment Katie [name] reports feeling encouraged about the state of the breathing/reduction in VCD sx since last visit. In 35 minutes of conversation SLP viewed pt's abdomial musculature x10 with each time confirming AB. reduction of VCD sx "has been 85% "on a good day". On days other than "a good day" a 70% reduction is noted, mostly due to incr'd stress and other emotions. SLP cont'd to encourage an ongoing dialogue with the counselor. Katie [name]  agreed and will cont to do these behaviors decreasing/eliminating VCD sx due to physiologic factors. Today Katie [name] also mentioned to SLP incr'd difficulty with talking on residual capacity in conversation, producing incr'd strain/tightness in laryngeal and pharyngeal area which incr'd freqeuncy of VCD sx. SLP told pt to focus on "STOP and BREATH" when this is noticed in the next two weeks. Pt agreed with this plan. SLP added goal for incr'd success with "STOP and BREATH in conversation.      Assessment / Recommendations / Plan   Plan Continue with current plan of care   possible d/c in next 1-2 visits     Progression Toward Goals   Progression toward goals Progressing toward goals   goal added             SLP Education - 10/12/21 1404     Education Details see pt note this date    Person(s) Educated Patient    Methods Explanation    Comprehension Verbalized understanding              SLP Short Term Goals - 10/12/21                      SLP SHORT TERM GOAL #1  Title pt will use AB in short answers (4-7 words) 80% of the time in 2 sessions     Time 4   08-19-21    Period Weeks     Status Met     Target Date 08/28/21            SLP SHORT TERM GOAL #2    Title pt will use at least one relaxation strategy when pt feels VCD triggered between 3 sessions     Time 4    08-14-21    Period Weeks     Status Partially met     Target Date 08/28/21            SLP SHORT TERM GOAL #3    Title pt will tell SLP 3 relaxation techniques he can use to mitigate s/sx VCD in 3 sessions     Time 4    08-14-21, 08-27-21    Period Weeks     Status Partially met     Target Date 08/28/21                     SLP Long Term Goals - 10/12/21                      SLP LONG TERM GOAL #1    Title pt will exhibit no more than 3 throat clears and/or coughs in 3 sessions     Time 8     08-19-21, 09-01-21    Period Weeks     Status Met     Target Date 09/23/21            SLP LONG TERM GOAL #2     Title pt will tell SLP pt utilized relaxation techniques to quell a coughing episode/need to throat clear between 3 sessions     Time 8   08-14-21, 09-01-21    Period Weeks     Status Met     Target Date 09/23/21            SLP LONG TERM GOAL #3    Title pt will demo AB with 10 minutes simple conversation in 3 sessions     Time 8     08-19-21, 09-21-21    Period Weeks     Status Met    Target Date 09/23/21            SLP LONG TERM GOAL #4    Title pt will indicate progress in ST by a score > 16 on Communication Participation Survey     Time 8     Period Weeks     Status Ongoing     Target Date 11/13/21      SLP LONG TERM GOAL #5  Title pt will iindicate less frequent speech on residual volume from focus on "STOP and BREATH" technique  Time 4  Period Weeks   Status Ongoing   Target Date 11/13/21     Plan - 10/12/21       Clinical Impression Statement Recert today. Katie [name]'s throat clears were none in 35 minutes conversation, see "skilled intervention" for more details on today's session. Focus will be on reducing frequency of speaking on residual volume which exacerbates VCD sx. Pt will cont to benefit from skilled ST teaching pt how to use abdominal breathing in conversation, as well as training pt in how to use visual imagery for relaxation of voice tract to reduce frequency and severity of cough/throat clearing,  and thus incr pt's quality of life. Suspect discharge in 1-2 more visits.                     Speech Therapy Frequency Once every other week     Duration 8 weeks     Treatment/Interventions Cueing hierarchy;Functional tasks;Patient/family education;Compensatory strategies;Internal/external aids;Environmental controls;SLP instruction and feedback     Potential to [place] provided today     Consulted and Agree with Plan of Care Patient            Patient will benefit from skilled therapeutic intervention in order to improve the  following deficits and impairments:   Other voice and resonance disorders    Problem List Patient Active Problem List   Diagnosis Date Noted   Chronic cough 10/01/2021   Excessive daytime sleepiness 10/01/2021   Irritable larynx 10/01/2021   Sinusitis, chronic 10/01/2021   Attention deficit hyperactivity disorder, predominantly inattentive type 08/06/2021   Endometriosis 10/12/2020   PTSD (post-traumatic stress disorder) 10/12/2020   Bipolar disorder ([place]) 07/07/2018   Herpes simplex 07/07/2018   Indication for care in labor or delivery 01/19/2017   Preterm labor 01/10/2017    [facility] [place] 10/12/2021, 2:44 PM  [facility] [address], [place], [place], [address] Phone: [phone]   Fax:  [phone]   Name: Katie [name] MRN: [id number] Date of Birth: 1985/08/03

## 2021-10-17 DIAGNOSIS — F3176 Bipolar disorder, in full remission, most recent episode depressed: Secondary | ICD-10-CM | POA: Diagnosis not present

## 2021-10-19 ENCOUNTER — Telehealth: Payer: Self-pay | Admitting: Adult Health

## 2021-10-19 NOTE — Telephone Encounter (Signed)
To be evaluated for an organic sleepiness disorder such as narcolepsy, patient would have to taper off all psychotropic medications that can cause sleepiness. Please advise patient to discuss with his provider, if this is a feasible option. Currently not appropriate for a follow up in sleep clinic. Please have patient FU with prescribing provider(s).

## 2021-10-19 NOTE — Telephone Encounter (Signed)
Dr. Rexene Alberts, patient was seen previously in office for OSA - repeat recent sleep study did not show evidence of sleep apnea. Does follow with psych for hx of bipolar disorder, OCD and ADD currently on  Xanax, lamotrigine, lithium, Seroquel, topiramate, and Adderall. Apparently, psych and PCP are advising patient to follow back up with use to further evaluate continued fatigue (as noted in note by [facility]). Unsure if you would like to see patient to further evaluate as requested or if this is even an appropriate request (especially with extensive medication likely contributing). Thank you.

## 2021-10-19 NOTE — Telephone Encounter (Signed)
Contacted pt back, pt stated psychiatrist & PCP recommended pt find other causes, primary sleep disorders or try other test that doesn't evolve sleep apnea. Still having trouble sleeping, waking up a lot, does feel rested even after being in REM sleep and constantly exhausted.

## 2021-10-19 NOTE — Telephone Encounter (Signed)
Pt called and stated her psychiatrist suggested she gets a Neuro work.

## 2021-10-21 DIAGNOSIS — Z8659 Personal history of other mental and behavioral disorders: Secondary | ICD-10-CM | POA: Diagnosis not present

## 2021-10-21 DIAGNOSIS — F649 Gender identity disorder, unspecified: Secondary | ICD-10-CM | POA: Diagnosis not present

## 2021-10-21 DIAGNOSIS — F319 Bipolar disorder, unspecified: Secondary | ICD-10-CM | POA: Diagnosis not present

## 2021-10-26 ENCOUNTER — Ambulatory Visit: Payer: BC Managed Care – PPO

## 2021-10-26 DIAGNOSIS — R498 Other voice and resonance disorders: Secondary | ICD-10-CM

## 2021-10-26 NOTE — Therapy (Signed)
[facility] [address] [place], [place], [address] Phone: [phone]   Fax:  [phone]  Speech Language Pathology Treatment/Discharge Summary  Patient Details  Name: Katie [name] MRN: [id number] Date of Birth: 1985-08-26 Referring Provider (SLP): Kalman Shan, MD   Encounter Date: 10/26/2021   End of Session - 10/26/21 1418     Visit Number 10    Number of Visits 13    Date for SLP Re-Evaluation 11/13/21    SLP Start Time 0936    SLP Stop Time  1015    SLP Time Calculation (min) 39 min    Activity Tolerance Patient tolerated treatment well             Past Medical History:  Diagnosis Date   Anxiety    Bipolar 1 disorder ([facility])    Depression    Excessive daytime sleepiness    Fibroid    Genital herpes     Past Surgical History:  Procedure Laterality Date   NO PAST SURGERIES      There were no vitals filed for this visit.   Subjective Assessment - 10/26/21 1428     Subjective "I'm really really happy with all of the progress I've made."    Currently in Pain? No/denies           SPEECH THERAPY DISCHARGE SUMMARY  Visits from Start of Care: 10  Current functional level related to goals / functional outcomes: See below   Remaining deficits: Mild/infrequent s/sx VCD - SLP suspects (and pt agrees) mostly due to either environmental or emotional/psychological factors.    Education / Equipment: Relaxation techniques, need to speak with adequate breath, abdominal breathing.    Patient agrees to discharge. Patient goals were partially met. Patient is being discharged due to being pleased with the current functional level..           ADULT SLP TREATMENT - 10/26/21 0941       General Information   Behavior/Cognition Alert;Cooperative;Pleasant mood      Treatment Provided   Treatment provided Cognitive-Linquistic      Cognitive-Linquistic Treatment   Treatment focused on Voice     Skilled Treatment Monica Martinez states to SLP that in the past two weeks there have been more external (psychological and stress) and environmental (weather patterns) which have impacted a "minor increase" in sx of VCD since last session. SLP observed pt in mod complex conversation today with >90% abdominal breathng (AB). Monica Martinez mentioned there is presentation Thursday which there will be requirement of presenting; VCD sx often occur when pt is engaging in technical conversation so SLP asked pt what could be done to limit the "unknowns" in conversation and pt included adequate and appropriate solutions. Within 5 seconds of [name] talking about the disappointing aspects of the job throat clearing occurred x1. SLP believes this sx was due to psychological/emotional factor. In 3 more minutes of monologue about the negative aspects of her job pt did not clear throat. SLP encouraged pt to cont with psych counseling/treatment to cont to decr VCD sx with that as basis. Speaking on residual volume today when talking about challenges at work x1 but did not result in sx of VCD. Pt is comfortable with and agrees with d/c today.      Assessment / Recommendations / Plan   Plan Discharge SLP treatment due to (comment)   comfortable with current level of success     Progression Toward Goals   Progression toward goals --  d/c day - see goals             SLP Short Term Goals - 10/26/21                      SLP SHORT TERM GOAL #1    Title pt will use AB in short answers (4-7 words) 80% of the time in 2 sessions     Time 4   08-19-21    Period Weeks     Status Met     Target Date 08/28/21            SLP SHORT TERM GOAL #2    Title pt will use at least one relaxation strategy when pt feels VCD triggered between 3 sessions     Time 4    08-14-21    Period Weeks     Status Partially met     Target Date 08/28/21            SLP SHORT TERM GOAL #3    Title pt will tell SLP 3 relaxation techniques he can use to mitigate  s/sx VCD in 3 sessions     Time 4    08-14-21, 08-27-21    Period Weeks     Status Partially met     Target Date 08/28/21                     SLP Long Term Goals - 10/26/21                      SLP LONG TERM GOAL #1    Title pt will exhibit no more than 3 throat clears and/or coughs in 3 sessions     Time 8     08-19-21, 09-01-21    Period Weeks     Status Met     Target Date 09/23/21            SLP LONG TERM GOAL #2    Title pt will tell SLP pt utilized relaxation techniques to quell a coughing episode/need to throat clear between 3 sessions     Time 8   08-14-21, 09-01-21    Period Weeks     Status Met     Target Date 09/23/21            SLP LONG TERM GOAL #3    Title pt will demo AB with 10 minutes simple conversation in 3 sessions     Time 8     08-19-21, 09-21-21    Period Weeks     Status Met    Target Date 09/23/21            SLP LONG TERM GOAL #4    Title pt will indicate progress in ST by a score > 16 on Communication Participation Survey     Time 8     Period Weeks     Status Partially met     Target Date 11/13/21         SLP LONG TERM GOAL #5  Title pt will indicate less frequent speech on residual volume from focus on "STOP and BREATH" technique  Time 4  Period Weeks   Status Met  Target Date 11/13/21      Plan - 10/26/21       Clinical Impression Statement [name]'s throat clears were one in today's session, see "skilled intervention" for more details on today's svisit. Monica Martinez agrees SLP  has shared all possible at this point and that counseling is important to cont, in order to cont to mitigate sx VCD. Monica Martinez agreed with d/c today.                             Treatment/Interventions Cueing hierarchy;Functional tasks;Patient/family education;Compensatory strategies;Internal/external aids;Environmental controls;SLP instruction and feedback     Potential to Achieve Goals Good          Consulted and Agree with Plan of Care Patient              Patient will benefit from skilled therapeutic intervention in order to improve the following deficits and impairments:   Other voice and resonance disorders    Problem List Patient Active Problem List   Diagnosis Date Noted   Chronic cough 10/01/2021   Excessive daytime sleepiness 10/01/2021   Irritable larynx 10/01/2021   Sinusitis, chronic 10/01/2021   Attention deficit hyperactivity disorder, predominantly inattentive type 08/06/2021   Endometriosis 10/12/2020   PTSD (post-traumatic stress disorder) 10/12/2020   Bipolar disorder ([facility]) 07/07/2018   Herpes simplex 07/07/2018   Indication for care in labor or delivery 01/19/2017   Preterm labor 01/10/2017    Verdie Mosher, CCC-SLP 10/26/2021, 2:28 PM  [facility] [address], [address] [place], [place], [address] Phone: [phone]   Fax:  [phone]   Name: Gardenia Beier MRN: [id number] Date of Birth: 1985-08-20

## 2021-10-29 DIAGNOSIS — F3176 Bipolar disorder, in full remission, most recent episode depressed: Secondary | ICD-10-CM | POA: Diagnosis not present

## 2021-11-02 DIAGNOSIS — Z6823 Body mass index (BMI) 23.0-23.9, adult: Secondary | ICD-10-CM | POA: Diagnosis not present

## 2021-11-02 DIAGNOSIS — Z01419 Encounter for gynecological examination (general) (routine) without abnormal findings: Secondary | ICD-10-CM | POA: Diagnosis not present

## 2021-11-02 DIAGNOSIS — Z124 Encounter for screening for malignant neoplasm of cervix: Secondary | ICD-10-CM | POA: Diagnosis not present

## 2021-11-06 DIAGNOSIS — F3176 Bipolar disorder, in full remission, most recent episode depressed: Secondary | ICD-10-CM | POA: Diagnosis not present

## 2021-11-14 DIAGNOSIS — F3176 Bipolar disorder, in full remission, most recent episode depressed: Secondary | ICD-10-CM | POA: Diagnosis not present

## 2021-11-17 ENCOUNTER — Ambulatory Visit: Payer: BC Managed Care – PPO

## 2021-11-17 DIAGNOSIS — F319 Bipolar disorder, unspecified: Secondary | ICD-10-CM | POA: Diagnosis not present

## 2021-11-17 DIAGNOSIS — G479 Sleep disorder, unspecified: Secondary | ICD-10-CM | POA: Diagnosis not present

## 2021-11-17 DIAGNOSIS — R400 Somnolence: Secondary | ICD-10-CM | POA: Diagnosis not present

## 2021-11-17 DIAGNOSIS — F649 Gender identity disorder, unspecified: Secondary | ICD-10-CM | POA: Diagnosis not present

## 2021-11-19 DIAGNOSIS — N809 Endometriosis, unspecified: Secondary | ICD-10-CM | POA: Diagnosis not present

## 2021-11-19 DIAGNOSIS — F319 Bipolar disorder, unspecified: Secondary | ICD-10-CM | POA: Diagnosis not present

## 2021-11-19 DIAGNOSIS — N946 Dysmenorrhea, unspecified: Secondary | ICD-10-CM | POA: Diagnosis not present

## 2021-11-19 DIAGNOSIS — Z309 Encounter for contraceptive management, unspecified: Secondary | ICD-10-CM | POA: Diagnosis not present

## 2021-11-19 DIAGNOSIS — F649 Gender identity disorder, unspecified: Secondary | ICD-10-CM | POA: Diagnosis not present

## 2021-11-19 DIAGNOSIS — Z8659 Personal history of other mental and behavioral disorders: Secondary | ICD-10-CM | POA: Diagnosis not present

## 2021-11-20 DIAGNOSIS — K219 Gastro-esophageal reflux disease without esophagitis: Secondary | ICD-10-CM | POA: Diagnosis not present

## 2021-11-20 DIAGNOSIS — F3176 Bipolar disorder, in full remission, most recent episode depressed: Secondary | ICD-10-CM | POA: Diagnosis not present

## 2021-11-23 NOTE — H&P (Signed)
Katie [name] is an 36 y.o. adult. Presenting for scheduled surgery. The patient has a longstanding history of AUB and has failed multiple medical modalities including OCPs, mIUD, orlissa, and TXA. Permanent sterility is desired. Periods are painful, heavy, and irregular. Katie [name] was assigned female gender at birth but identifies as female - is eventually planning gender-affirming hormone replacement with testosterone therapy.    Pertinent Gynecological History: Menses: flow is excessive with use of 6 pads or tampons on heaviest days and with severe dysmenorrhea Bleeding: dysfunctional uterine bleeding Contraception: IUD DES exposure: denies Blood transfusions: none Sexually transmitted diseases: HSV Previous GYN Procedures:  None   Last pap: normal Date: 10/2021 OB History: G1, P1   Menstrual History: No LMP recorded.    Past Medical History:  Diagnosis Date   Anxiety    Bipolar 1 disorder ([place])    Depression    Excessive daytime sleepiness    Fibroid    Genital herpes     Past Surgical History:  Procedure Laterality Date   NO PAST SURGERIES      Family History  Problem Relation Age of Onset   Arrhythmia Mother    Hypertension Father    Arrhythmia Maternal Grandmother    Diabetes Paternal Grandmother    Hypertension Paternal Grandmother    Diabetes Paternal Grandfather    Hypertension Paternal Grandfather     Social History:  reports that she has never smoked. She has never used smokeless tobacco. She reports that she does not currently use alcohol. She reports that she does not use drugs.  Allergies:  Allergies  Allergen Reactions   Nitrofurantoin Nausea And Vomiting and Other (See Comments)    Other reaction(s): Abdominal Pain, GI Intolerance    No medications prior to admission.    Review of Systems  There were no vitals taken for this visit. Physical Exam Gen: well appearing, NAD CV: Reg rate Pulm: NWOB Abd: soft, nondistended, nontender, no  masses GYN: uterus 8 week size, no adnexa ttp/CMT, IUD strings seen Ext: No edema b/l   No results found for this or any previous visit (from the past 24 hour(s)).  No results found.  Assessment/Plan: 36 yo G1P1 presenting for surgical management of suspected endometriosis, AUB, dysmenorrhea, and desire for sterility. The surgical plan is diagnostic laparoscopy, bilateral tubal ligation, hsyteroscopy, dilation, curettage, novasure endometrial ablation, IUD removal, possible fulgaration of endometriosis, possible lysis of adhesions, and possible cystoscopy.    The patient has failed multiple medical modalities and has had normal labs and imaging. I strongly suspect endometriosis. Katie [name] was counseled regarding options in the office and elected to have diagnostic laparoscopy in addition to a bilateral tubal ligation. Would also like an endometrial ablation with removal of IUD.   Risks were reviewed including infection, bleeding, damage to surrounding structures, need for additional procedures, conversion to an open procedure, pain s/s scarring, the possibility of cancer/unexpected abnormality on final pathology, failure, ectopic pregnancy, regret, the possibility of not fixing this issue, the possibility of not finding any endometriosis, and thromboembolic disease.  We discussed my plan for surgery which includes diagnostic laparoscopy, bilateral tubal ligation, hsyteroscopy, dilation, curettage, novasure endometrial ablation, IUD removal, possible fulgaration of endometriosis, possible lysis of adhesions, and possible cystoscopy.    Colin Benton Alecsander Hattabaugh 11/23/2021, 1:29 PM

## 2021-11-27 ENCOUNTER — Encounter (HOSPITAL_BASED_OUTPATIENT_CLINIC_OR_DEPARTMENT_OTHER): Payer: Self-pay | Admitting: Obstetrics and Gynecology

## 2021-11-27 DIAGNOSIS — F3176 Bipolar disorder, in full remission, most recent episode depressed: Secondary | ICD-10-CM | POA: Diagnosis not present

## 2021-11-27 NOTE — Progress Notes (Signed)
Spoke w/ via phone for pre-op interview--- [name] Lab needs dos----UPT and T&S              Lab results------ COVID test -----patient states asymptomatic no test needed Arrive at -------0530 NPO after MN NO Solid Food.   Med rec completed Medications to take morning of surgery ----- Albuterol (Bring), Lamictal, Prilosec, Topamax and Valtrex. Diabetic medication ----- Patient instructed no nail polish to be worn day of surgery Patient instructed to bring photo id and insurance card day of surgery Patient aware to have Driver (ride ) / caregiver Corky Downs   for 24 hours after surgery  Patient Special Instructions ----- Pre-Op special Istructions ----- Patient verbalized understanding of instructions that were given at this phone interview. Patient denies shortness of breath, chest pain, fever, cough at this phone interview.

## 2021-12-04 DIAGNOSIS — F3176 Bipolar disorder, in full remission, most recent episode depressed: Secondary | ICD-10-CM | POA: Diagnosis not present

## 2021-12-06 NOTE — Anesthesia Preprocedure Evaluation (Addendum)
Anesthesia Evaluation  Patient identified by MRN, date of birth, ID band Patient awake    Reviewed: Allergy & Precautions, NPO status , Patient's Chart, lab work & pertinent test results  Airway Mallampati: II  TM Distance: >3 FB Neck ROM: Full    Dental no notable dental hx. (+) Dental Advisory Given, Teeth Intact   Pulmonary asthma ,    Pulmonary exam normal breath sounds clear to auscultation       Cardiovascular negative cardio ROS Normal cardiovascular exam Rhythm:Regular Rate:Normal     Neuro/Psych PSYCHIATRIC DISORDERS Anxiety Depression Bipolar Disorder negative neurological ROS     GI/Hepatic Neg liver ROS, GERD  ,  Endo/Other  negative endocrine ROS  Renal/GU negative Renal ROS     Musculoskeletal negative musculoskeletal ROS (+)   Abdominal   Peds  Hematology negative hematology ROS (+)   Anesthesia Other Findings   Reproductive/Obstetrics negative OB ROS                           Anesthesia Physical Anesthesia Plan  ASA: 2  Anesthesia Plan: General   Post-op Pain Management: Tylenol PO (pre-op)* and Toradol IV (intra-op)*   Induction: Intravenous  PONV Risk Score and Plan: 4 or greater and Ondansetron, Dexamethasone, Treatment may vary due to age or medical condition, Midazolam and Scopolamine patch - Pre-op  Airway Management Planned: Oral ETT  Additional Equipment: None  Intra-op Plan:   Post-operative Plan: Extubation in OR  Informed Consent: I have reviewed the patients History and Physical, chart, labs and discussed the procedure including the risks, benefits and alternatives for the proposed anesthesia with the patient or authorized representative who has indicated his/her understanding and acceptance.     Dental advisory given  Plan Discussed with: CRNA  Anesthesia Plan Comments:       Anesthesia Quick Evaluation

## 2021-12-07 ENCOUNTER — Encounter (HOSPITAL_BASED_OUTPATIENT_CLINIC_OR_DEPARTMENT_OTHER)
Admission: RE | Disposition: A | Discharge status: Home / Self Care | Payer: Self-pay | Source: Home / Self Care | Attending: Obstetrics and Gynecology

## 2021-12-07 ENCOUNTER — Encounter (HOSPITAL_BASED_OUTPATIENT_CLINIC_OR_DEPARTMENT_OTHER): Payer: Self-pay | Admitting: Obstetrics and Gynecology

## 2021-12-07 ENCOUNTER — Ambulatory Visit (HOSPITAL_BASED_OUTPATIENT_CLINIC_OR_DEPARTMENT_OTHER)
Admission: RE | Admit: 2021-12-07 | Discharge: 2021-12-07 | Disposition: A | Discharge status: Home / Self Care | Payer: BC Managed Care – PPO | Attending: Obstetrics and Gynecology | Admitting: Obstetrics and Gynecology

## 2021-12-07 ENCOUNTER — Other Ambulatory Visit: Payer: Self-pay

## 2021-12-07 ENCOUNTER — Ambulatory Visit (HOSPITAL_BASED_OUTPATIENT_CLINIC_OR_DEPARTMENT_OTHER): Payer: BC Managed Care – PPO | Admitting: Anesthesiology

## 2021-12-07 DIAGNOSIS — F319 Bipolar disorder, unspecified: Secondary | ICD-10-CM | POA: Insufficient documentation

## 2021-12-07 DIAGNOSIS — K219 Gastro-esophageal reflux disease without esophagitis: Secondary | ICD-10-CM | POA: Diagnosis not present

## 2021-12-07 DIAGNOSIS — Z302 Encounter for sterilization: Secondary | ICD-10-CM | POA: Insufficient documentation

## 2021-12-07 DIAGNOSIS — N809 Endometriosis, unspecified: Secondary | ICD-10-CM | POA: Diagnosis not present

## 2021-12-07 DIAGNOSIS — N939 Abnormal uterine and vaginal bleeding, unspecified: Secondary | ICD-10-CM | POA: Insufficient documentation

## 2021-12-07 DIAGNOSIS — N946 Dysmenorrhea, unspecified: Secondary | ICD-10-CM | POA: Diagnosis not present

## 2021-12-07 DIAGNOSIS — F419 Anxiety disorder, unspecified: Secondary | ICD-10-CM | POA: Diagnosis not present

## 2021-12-07 DIAGNOSIS — J45909 Unspecified asthma, uncomplicated: Secondary | ICD-10-CM | POA: Insufficient documentation

## 2021-12-07 DIAGNOSIS — Z30432 Encounter for removal of intrauterine contraceptive device: Secondary | ICD-10-CM | POA: Diagnosis not present

## 2021-12-07 HISTORY — PX: DILITATION & CURRETTAGE/HYSTROSCOPY WITH NOVASURE ABLATION: SHX5568

## 2021-12-07 HISTORY — PX: IUD REMOVAL: SHX5392

## 2021-12-07 HISTORY — PX: LAPAROSCOPIC TUBAL LIGATION: SHX1937

## 2021-12-07 HISTORY — PX: OVARIAN CYST REMOVAL: SHX89

## 2021-12-07 HISTORY — DX: Unspecified asthma, uncomplicated: J45.909

## 2021-12-07 HISTORY — DX: Other congenital malformations of larynx: Q31.8

## 2021-12-07 HISTORY — DX: Gastro-esophageal reflux disease without esophagitis: K21.9

## 2021-12-07 HISTORY — DX: Dermatitis, unspecified: L30.9

## 2021-12-07 LAB — TYPE AND SCREEN
ABO/RH(D): O NEG
Antibody Screen: NEGATIVE

## 2021-12-07 LAB — POCT PREGNANCY, URINE: Preg Test, Ur: NEGATIVE

## 2021-12-07 SURGERY — DILATATION & CURETTAGE/HYSTEROSCOPY WITH NOVASURE ABLATION
Anesthesia: General | Site: Vagina

## 2021-12-07 MED ORDER — OXYCODONE HCL 5 MG/5ML PO SOLN: 5.0000 mg | Freq: Once | ORAL | Status: DC | PRN

## 2021-12-07 MED ORDER — SUGAMMADEX SODIUM 200 MG/2ML IV SOLN
INTRAVENOUS | Status: DC | PRN
  Administered 2021-12-07: 200 mg via INTRAVENOUS

## 2021-12-07 MED ORDER — SCOPOLAMINE 1 MG/3DAYS TD PT72
1.0000 | MEDICATED_PATCH | TRANSDERMAL | Status: DC
Start: 2021-12-07 — End: 2021-12-07
  Administered 2021-12-07: 1.5 mg via TRANSDERMAL

## 2021-12-07 MED ORDER — ROCURONIUM BROMIDE 10 MG/ML (PF) SYRINGE
PREFILLED_SYRINGE | INTRAVENOUS | Status: AC
  Filled 2021-12-07: qty 10

## 2021-12-07 MED ORDER — PROPOFOL 10 MG/ML IV BOLUS
INTRAVENOUS | Status: DC | PRN
  Administered 2021-12-07: 140 mg via INTRAVENOUS

## 2021-12-07 MED ORDER — LIDOCAINE HCL 1 % IJ SOLN
INTRAMUSCULAR | Status: AC
  Filled 2021-12-07: qty 20

## 2021-12-07 MED ORDER — DEXAMETHASONE SODIUM PHOSPHATE 10 MG/ML IJ SOLN
INTRAMUSCULAR | Status: AC
  Filled 2021-12-07: qty 1

## 2021-12-07 MED ORDER — AMISULPRIDE (ANTIEMETIC) 5 MG/2ML IV SOLN: 10.0000 mg | Freq: Once | INTRAVENOUS | Status: DC | PRN

## 2021-12-07 MED ORDER — KETOROLAC TROMETHAMINE 30 MG/ML IJ SOLN
INTRAMUSCULAR | Status: DC | PRN
  Administered 2021-12-07: 30 mg via INTRAVENOUS

## 2021-12-07 MED ORDER — SCOPOLAMINE 1 MG/3DAYS TD PT72
MEDICATED_PATCH | TRANSDERMAL | Status: AC
  Filled 2021-12-07: qty 1

## 2021-12-07 MED ORDER — FENTANYL CITRATE (PF) 100 MCG/2ML IJ SOLN
INTRAMUSCULAR | Status: DC | PRN
  Administered 2021-12-07 (×2): 50 ug via INTRAVENOUS

## 2021-12-07 MED ORDER — MIDAZOLAM HCL 2 MG/2ML IJ SOLN
INTRAMUSCULAR | Status: AC
  Filled 2021-12-07: qty 2

## 2021-12-07 MED ORDER — ACETAMINOPHEN 500 MG PO TABS
ORAL_TABLET | ORAL | Status: AC
  Filled 2021-12-07: qty 2

## 2021-12-07 MED ORDER — FENTANYL CITRATE (PF) 100 MCG/2ML IJ SOLN
25.0000 ug | INTRAMUSCULAR | Status: DC | PRN
  Administered 2021-12-07: 25 ug via INTRAVENOUS

## 2021-12-07 MED ORDER — ROCURONIUM BROMIDE 10 MG/ML (PF) SYRINGE
PREFILLED_SYRINGE | INTRAVENOUS | Status: DC | PRN
  Administered 2021-12-07: 50 mg via INTRAVENOUS

## 2021-12-07 MED ORDER — ACETAMINOPHEN 500 MG PO TABS
1000.0000 mg | ORAL_TABLET | Freq: Once | ORAL | Status: AC
Start: 2021-12-07 — End: 2021-12-07
  Administered 2021-12-07: 1000 mg via ORAL

## 2021-12-07 MED ORDER — ONDANSETRON HCL 4 MG/2ML IJ SOLN
INTRAMUSCULAR | Status: DC | PRN
  Administered 2021-12-07: 4 mg via INTRAVENOUS

## 2021-12-07 MED ORDER — PROPOFOL 10 MG/ML IV BOLUS
INTRAVENOUS | Status: AC
  Filled 2021-12-07: qty 20

## 2021-12-07 MED ORDER — SILVER NITRATE-POT NITRATE 75-25 % EX MISC
CUTANEOUS | Status: AC
  Filled 2021-12-07: qty 10

## 2021-12-07 MED ORDER — PROMETHAZINE HCL 25 MG/ML IJ SOLN: 6.2500 mg | INTRAMUSCULAR | Status: DC | PRN

## 2021-12-07 MED ORDER — OXYCODONE HCL 5 MG PO TABS: 5.0000 mg | ORAL_TABLET | Freq: Once | ORAL | Status: DC | PRN

## 2021-12-07 MED ORDER — ONDANSETRON HCL 4 MG/2ML IJ SOLN
INTRAMUSCULAR | Status: AC
  Filled 2021-12-07: qty 2

## 2021-12-07 MED ORDER — LIDOCAINE HCL 1 % IJ SOLN
INTRAMUSCULAR | Status: DC | PRN
  Administered 2021-12-07: 10 mL

## 2021-12-07 MED ORDER — MIDAZOLAM HCL 2 MG/2ML IJ SOLN
INTRAMUSCULAR | Status: DC | PRN
  Administered 2021-12-07: 2 mg via INTRAVENOUS

## 2021-12-07 MED ORDER — IBUPROFEN 800 MG PO TABS: 800.0000 mg | ORAL_TABLET | Freq: Three times a day (TID) | ORAL | 0 refills | Status: DC | PRN

## 2021-12-07 MED ORDER — BUPIVACAINE HCL (PF) 0.25 % IJ SOLN
INTRAMUSCULAR | Status: AC
  Filled 2021-12-07: qty 30

## 2021-12-07 MED ORDER — DEXAMETHASONE SODIUM PHOSPHATE 10 MG/ML IJ SOLN
INTRAMUSCULAR | Status: DC | PRN
  Administered 2021-12-07: 10 mg via INTRAVENOUS

## 2021-12-07 MED ORDER — LIDOCAINE 2% (20 MG/ML) 5 ML SYRINGE
INTRAMUSCULAR | Status: DC | PRN
  Administered 2021-12-07: 60 mg via INTRAVENOUS

## 2021-12-07 MED ORDER — BUPIVACAINE HCL (PF) 0.25 % IJ SOLN
INTRAMUSCULAR | Status: DC | PRN
  Administered 2021-12-07: 8 mL

## 2021-12-07 MED ORDER — MEPERIDINE HCL 25 MG/ML IJ SOLN: 6.2500 mg | INTRAMUSCULAR | Status: DC | PRN

## 2021-12-07 MED ORDER — FENTANYL CITRATE (PF) 100 MCG/2ML IJ SOLN
INTRAMUSCULAR | Status: AC
  Filled 2021-12-07: qty 2

## 2021-12-07 MED ORDER — SODIUM CHLORIDE 0.9 % IR SOLN
Status: DC | PRN
  Administered 2021-12-07: 3000 mL

## 2021-12-07 MED ORDER — LACTATED RINGERS IV SOLN: INTRAVENOUS | Status: DC

## 2021-12-07 MED ORDER — POVIDONE-IODINE 10 % EX SWAB: 2.0000 | Freq: Once | CUTANEOUS | Status: DC

## 2021-12-07 MED ORDER — KETOROLAC TROMETHAMINE 30 MG/ML IJ SOLN
INTRAMUSCULAR | Status: AC
  Filled 2021-12-07: qty 1

## 2021-12-07 MED ORDER — DOCUSATE SODIUM 100 MG PO CAPS: 100.0000 mg | ORAL_CAPSULE | Freq: Two times a day (BID) | ORAL | 2 refills | Status: DC

## 2021-12-07 MED ORDER — LIDOCAINE HCL (PF) 2 % IJ SOLN
INTRAMUSCULAR | Status: AC
  Filled 2021-12-07: qty 5

## 2021-12-07 MED ORDER — HYDROCODONE-ACETAMINOPHEN 5-325MG PREPACK (~~LOC~~: ORAL_TABLET | ORAL | 0 refills | Status: DC

## 2021-12-07 SURGICAL SUPPLY — 46 items
ABLATOR SURESOUND NOVASURE (ABLATOR) ×1 IMPLANT
ADH SKN CLS APL DERMABOND .7 (GAUZE/BANDAGES/DRESSINGS) ×3
CATH ROBINSON RED A/P 16FR (CATHETERS) ×4 IMPLANT
COVER MAYO STAND STRL (DRAPES) ×4 IMPLANT
DERMABOND ADVANCED (GAUZE/BANDAGES/DRESSINGS) ×1
DERMABOND ADVANCED .7 DNX12 (GAUZE/BANDAGES/DRESSINGS) ×3 IMPLANT
DILATOR CANAL MILEX (MISCELLANEOUS) IMPLANT
DRSG OPSITE POSTOP 3X4 (GAUZE/BANDAGES/DRESSINGS) IMPLANT
DRSG TELFA 3X8 NADH (GAUZE/BANDAGES/DRESSINGS) ×4 IMPLANT
DURAPREP 26ML APPLICATOR (WOUND CARE) ×4 IMPLANT
ELECT REM PT RETURN 9FT ADLT (ELECTROSURGICAL)
ELECTRODE REM PT RTRN 9FT ADLT (ELECTROSURGICAL) IMPLANT
GAUZE 4X4 16PLY ~~LOC~~+RFID DBL (SPONGE) ×8 IMPLANT
GAUZE PETROLATUM 1 X8 (GAUZE/BANDAGES/DRESSINGS) IMPLANT
GLOVE BIO SURGEON STRL SZ 6.5 (GLOVE) ×4 IMPLANT
GLOVE BIOGEL PI IND STRL 6.5 (GLOVE) ×3 IMPLANT
GLOVE BIOGEL PI IND STRL 7.0 (GLOVE) ×6 IMPLANT
GLOVE BIOGEL PI INDICATOR 6.5 (GLOVE) ×1
GLOVE BIOGEL PI INDICATOR 7.0 (GLOVE) ×2
GOWN STRL REUS W/TWL LRG LVL3 (GOWN DISPOSABLE) ×4 IMPLANT
IV NS IRRIG 3000ML ARTHROMATIC (IV SOLUTION) ×4 IMPLANT
KIT PROCEDURE FLUENT (KITS) ×4 IMPLANT
KIT TURNOVER CYSTO (KITS) ×4 IMPLANT
LIGASURE BLUNT TIP 5 LONG 44CM (ELECTROSURGICAL) ×1 IMPLANT
NDL INSUFFLATION 14GA 120MM (NEEDLE) ×3 IMPLANT
NEEDLE INSUFFLATION 14GA 120MM (NEEDLE) ×4 IMPLANT
NS IRRIG 500ML POUR BTL (IV SOLUTION) IMPLANT
PACK LAPAROSCOPY BASIN (CUSTOM PROCEDURE TRAY) ×4 IMPLANT
PACK TRENDGUARD 450 HYBRID PRO (MISCELLANEOUS) ×3 IMPLANT
PACK VAGINAL MINOR WOMEN LF (CUSTOM PROCEDURE TRAY) ×4 IMPLANT
PAD DRESSING TELFA 3X8 NADH (GAUZE/BANDAGES/DRESSINGS) ×3 IMPLANT
PAD OB MATERNITY 4.3X12.25 (PERSONAL CARE ITEMS) ×4 IMPLANT
PAD PREP 24X48 CUFFED NSTRL (MISCELLANEOUS) ×4 IMPLANT
PROTECTOR NERVE ULNAR (MISCELLANEOUS) ×8 IMPLANT
SEAL ROD LENS SCOPE MYOSURE (ABLATOR) ×4 IMPLANT
SET SUCTION IRRIG HYDROSURG (IRRIGATION / IRRIGATOR) IMPLANT
SET TUBE SMOKE EVAC HIGH FLOW (TUBING) ×4 IMPLANT
SUT VICRYL 0 UR6 27IN ABS (SUTURE) ×4 IMPLANT
SUT VICRYL RAPIDE 4/0 PS 2 (SUTURE) ×4 IMPLANT
TOWEL OR 17X26 10 PK STRL BLUE (TOWEL DISPOSABLE) ×4 IMPLANT
TRENDGUARD 450 HYBRID PRO PACK (MISCELLANEOUS) ×4
TROCAR BALLN 12MMX100 BLUNT (TROCAR) IMPLANT
TROCAR Z-THREAD FIOS 11X100 BL (TROCAR) ×4 IMPLANT
TROCAR Z-THREAD FIOS 5X100MM (TROCAR) IMPLANT
WARMER LAPAROSCOPE (MISCELLANEOUS) ×4 IMPLANT
WATER STERILE IRR 500ML POUR (IV SOLUTION) ×4 IMPLANT

## 2021-12-07 NOTE — Op Note (Signed)
PREOPERATIVE DIAGNOSES: 1. AUB 2. Desires permanent sterilization  POSTOPERATIVE DIAGNOSES: Same  PROCEDURE PERFORMED: Dilation, curretage, endometrial ablation, hysteroscopy, laparoscopy, bialteral tubal ligation, and IUD removal  SURGEON: Dr. Lucillie Garfinkel  ANESTHESIA: General  ESTIMATED BLOOD LOSS: 50cc.  COMPLICATIONS: None  TUBES: None.  DRAINS: None  Fluid deficit: <500cc  Novasure measurements: 4.5cm leght, 4cm width, CL 3cm  PATHOLOGY: Endometrial curretings  FINDINGS: On exam, under anesthesia, normal appearing vulva and vagina, 7 week sized uterus Normal fallopian tubes and ovaries bilaterally. The bowel and omentum were normal appearing.    Operative findings demonstrated normal findings. B/l ostia visualized  Procedure: The patient was taken to the operating room where she was properly prepped and draped in sterile manner under general anesthesia. After bimanual examination, the cervix was exposed with a weighted vaginal speculum and the anterior lip of the cervix grasped with a tenaculum.  The endocervical canal was then progressively dilated to 26m. The hysteroscope was then introduced into the uterine cavity using sterile saline solution as a distending media and with attached video camera. The endometrial cavity was distended with fluids and the cavity with the b/l ostia were visualized. Normal endometrial cavity was noted. Sharp curretage was then performed.  The Novasure device was then introduced. Novasure endometrial ablation was done without any complications per device instructions.  The scope was reintroduced and several pictures were taken of the burned endometrial cavity and the hysteroscope removed from the cavity. With a speculum in place to visualize the cervix, the cervix was grasped and a jacobs tenaculum was placed within the uterine cavity for manipulation purposes being careful not to puncture the uterus. Attention was then turned to the abdomen.    An infraumbilical incision was made and the Veress needle was gently advanced taking care to feel for the typical sensation of penetrating the peritoneum. With CO2 infiltration, an opening pressure of 2 mmHg was noted, and following this, a pneumoperitoneum of 15 mmHg was created. A 11 mm trocar was then passed through the same incision and the laparoscope was then inserted through the trocar sleeve. Visualization of the peritoneal cavity was then obtained and a brief inspection did not reveal any signs of complications from entry. Once the placement of the port was complete, the actual laparoscopic procedure began. Above operative findings noted.  A grasper was used to isolate the right fallopian tube following it down to its fimbriated end. Once confirmation of fallopian tube done, ligasure was used to dessicate and completely transect the fallopian tube. The same thing was done on the left fallopian tube. Hemostasis was noted and a complete inspection confirmed proper placement bilaterally.   All gas removed from abdomen and all instruments were removed. Fascia was closed with 0' vicryl and skin closed with 4'0 vicryl. The sponge and lap counts were correct times 2 at this time.  Jacobs tenaculum was removed.   The patient's procedure was terminated. We then awakened her. She was sent to the Recovery Room in good condition.    ELucillie GarfinkelMD

## 2021-12-07 NOTE — Anesthesia Procedure Notes (Signed)
Procedure Name: Intubation Date/Time: 12/07/2021 7:36 AM  Performed by: Mechele Claude, CRNAPre-anesthesia Checklist: Patient identified, Emergency Drugs available, Suction available and Patient being monitored Patient Re-evaluated:Patient Re-evaluated prior to induction Oxygen Delivery Method: Circle system utilized Preoxygenation: Pre-oxygenation with 100% oxygen Induction Type: IV induction Ventilation: Mask ventilation without difficulty Laryngoscope Size: Mac and 3 Tube type: Oral Tube size: 7.0 mm Number of attempts: 1 Airway Equipment and Method: Stylet and Oral airway Placement Confirmation: ETT inserted through vocal cords under direct vision, positive ETCO2 and breath sounds checked- equal and bilateral Secured at: 22 cm Tube secured with: Tape Dental Injury: Teeth and Oropharynx as per pre-operative assessment

## 2021-12-07 NOTE — Discharge Instructions (Addendum)
No acetaminophen/Tylenol until after 12:16pm today if needed for pain.   No ibuprofen, Advil, Aleve, Motrin, ketorolac, meloxicam, naproxen, or other NSAIDS until after 2:26pm today if needed  for pain.    DISCHARGE INSTRUCTIONS: HYSTEROSCOPY / ENDOMETRIAL ABLATION The following instructions have been prepared to help you care for yourself upon your return home.  May Remove Scop patch on or before  May take Ibuprofen after  May take stool softner while taking narcotic pain medication to prevent constipation.  Drink plenty of water.  Personal hygiene:  Use sanitary pads for vaginal drainage, not tampons.  Shower the day after your procedure.  NO tub baths, pools or Jacuzzis for 2-3 weeks.  Wipe front to back after using the bathroom.  Activity and limitations:  Do NOT drive or operate any equipment for 24 hours. The effects of anesthesia are still present and drowsiness may result.  Do NOT rest in bed all day.  Walking is encouraged.  Walk up and down stairs slowly.  You may resume your normal activity in one to two days or as indicated by your physician. Sexual activity: NO intercourse for at least 2 weeks after the procedure, or as indicated by your Doctor.  Diet: Eat a light meal as desired this evening. You may resume your usual diet tomorrow.  Return to Work: You may resume your work activities in one to two days or as indicated by Marine scientist.  What to expect after your surgery: Expect to have vaginal bleeding/discharge for 2-3 days and spotting for up to 10 days. It is not unusual to have soreness for up to 1-2 weeks. You may have a slight burning sensation when you urinate for the first day. Mild cramps may continue for a couple of days. You may have a regular period in 2-6 weeks.  Call your doctor for any of the following:  Excessive vaginal bleeding or clotting, saturating and changing one pad every hour.  Inability to urinate 6 hours after discharge from  hospital.  Pain not relieved by pain medication.  Fever of 100.4 F or greater.  Unusual vaginal discharge or odor.    DISCHARGE INSTRUCTIONS: Laparoscopy  The following instructions have been prepared to help you care for yourself upon your return home today.  Wound care:  Do not get the incision wet for the first 24 hours. The incision should be kept clean and dry.  The Band-Aids or dressings may be removed the day after surgery.  Should the incision become sore, red, and swollen after the first week, check with your doctor.  Personal hygiene:  Shower the day after your procedure.  Activity and limitations:  Do NOT drive or operate any equipment today.  Do NOT lift anything more than 15 pounds for 2-3 weeks after surgery.  Do NOT rest in bed all day.  Walking is encouraged. Walk each day, starting slowly with 5-minute walks 3 or 4 times a day. Slowly increase the length of your walks.  Walk up and down stairs slowly.  Do NOT do strenuous activities, such as golfing, playing tennis, bowling, running, biking, weight lifting, gardening, mowing, or vacuuming for 2-4 weeks. Ask your doctor when it is okay to start.  Diet: Eat a light meal as desired this evening. You may resume your usual diet tomorrow.  Return to work: This is dependent on the type of work you do. For the most part you can return to a desk job within a week of surgery. If you are more  active at work, please discuss this with your doctor.  What to expect after your surgery: You may have a slight burning sensation when you urinate on the first day. You may have a very small amount of blood in the urine. Expect to have a small amount of vaginal discharge/light bleeding for 1-2 weeks. It is not unusual to have abdominal soreness and bruising for up to 2 weeks. You may be tired and need more rest for about 1 week. You may experience shoulder pain for 24-72 hours. Lying flat in bed may relieve it.  Call your doctor for any  of the following:  Develop a fever of 100.4 or greater  Inability to urinate 6 hours after discharge from hospital  Severe pain not relieved by pain medications  Persistent of heavy bleeding at incision site  Redness or swelling around incision site after a week  Increasing nausea or vomiting     Post Anesthesia Home Care Instructions  Activity: Get plenty of rest for the remainder of the day. A responsible individual must stay with you for 24 hours following the procedure.  For the next 24 hours, DO NOT: -Drive a car -Paediatric nurse -Drink alcoholic beverages -Take any medication unless instructed by your physician -Make any legal decisions or sign important papers.  Meals: Start with liquid foods such as gelatin or soup. Progress to regular foods as tolerated. Avoid greasy, spicy, heavy foods. If nausea and/or vomiting occur, drink only clear liquids until the nausea and/or vomiting subsides. Call your physician if vomiting continues.  Special Instructions/Symptoms: Your throat may feel dry or sore from the anesthesia or the breathing tube placed in your throat during surgery. If this causes discomfort, gargle with warm salt water. The discomfort should disappear within 24 hours.  If you had a scopolamine patch placed behind your ear for the management of post- operative nausea and/or vomiting:  1. The medication in the patch is effective for 72 hours, after which it should be removed.  Wrap patch in a tissue and discard in the trash. Wash hands thoroughly with soap and water. 2. You may remove the patch earlier than 72 hours if you experience unpleasant side effects which may include dry mouth, dizziness or visual disturbances. 3. Avoid touching the patch. Wash your hands with soap and water after contact with the patch.

## 2021-12-07 NOTE — Progress Notes (Signed)
No updates to H&P. Patient arrived NPO and was consented in PACU. Risks again discussed, all questions answered, and consent signed. Proceed with surgery.    Lucillie Garfinkel MD

## 2021-12-07 NOTE — Anesthesia Postprocedure Evaluation (Signed)
Anesthesia Post Note  Patient: Teacher, adult education  Procedure(s) Performed: DILATATION & CURETTAGE/HYSTEROSCOPY WITH NOVASURE ABLATION (Vagina ) INTRAUTERINE DEVICE (IUD) REMOVAL (Vagina ) FULGURATION OF ENDOMETRIOSIS (Abdomen) LAPAROSCOPIC BILATERAL TUBAL LIGATION WITH CAUTERY (Bilateral: Abdomen)     Patient location during evaluation: PACU Anesthesia Type: General Level of consciousness: sedated and patient cooperative Pain management: pain level controlled Vital Signs Assessment: post-procedure vital signs reviewed and stable Respiratory status: spontaneous breathing Cardiovascular status: stable Anesthetic complications: no   No notable events documented.  Last Vitals:  Vitals:   12/07/21 0945 12/07/21 1023  BP: 125/79 124/76  Pulse: 67 65  Resp: 16 16  Temp: 36.4 C 36.7 C  SpO2: 99% 100%    Last Pain:  Vitals:   12/07/21 1023  TempSrc:   PainSc: [place]

## 2021-12-07 NOTE — Transfer of Care (Signed)
Immediate Anesthesia Transfer of Care Note  Patient: Katie [name]  Procedure(s) Performed: Procedure(s) (LRB): DILATATION & CURETTAGE/HYSTEROSCOPY WITH NOVASURE ABLATION (N/A) INTRAUTERINE DEVICE (IUD) REMOVAL (N/A) FULGURATION OF ENDOMETRIOSIS (N/A) LAPAROSCOPIC BILATERAL TUBAL LIGATION WITH CAUTERY (Bilateral)  Patient Location: PACU  Anesthesia Type: General  Level of Consciousness: awake, alert  and oriented  Airway & Oxygen Therapy: Patient Spontanous Breathing and Patient connected to nasal cannula oxygen  Post-op Assessment: Report given to PACU RN and Post -op Vital signs reviewed and stable  Post vital signs: Reviewed and stable  Complications: No apparent anesthesia complications  Last Vitals:  Vitals Value Taken Time  BP 120/71 12/07/21 0846  Temp    Pulse 72 12/07/21 0848  Resp 19 12/07/21 0848  SpO2 100 % 12/07/21 0848  Vitals shown include unvalidated device data.  Last Pain:  Vitals:   12/07/21 0604  TempSrc: Oral  PainSc: 1       Patients Stated Pain Goal: 3 (95/28/41 [id number])  Complications: No notable events documented.

## 2021-12-08 LAB — SURGICAL PATHOLOGY

## 2021-12-09 ENCOUNTER — Encounter (HOSPITAL_BASED_OUTPATIENT_CLINIC_OR_DEPARTMENT_OTHER): Payer: Self-pay | Admitting: Obstetrics and Gynecology

## 2021-12-11 DIAGNOSIS — F3176 Bipolar disorder, in full remission, most recent episode depressed: Secondary | ICD-10-CM | POA: Diagnosis not present

## 2021-12-15 DIAGNOSIS — R400 Somnolence: Secondary | ICD-10-CM | POA: Diagnosis not present

## 2021-12-22 DIAGNOSIS — R5383 Other fatigue: Secondary | ICD-10-CM | POA: Diagnosis not present

## 2021-12-23 DIAGNOSIS — F3176 Bipolar disorder, in full remission, most recent episode depressed: Secondary | ICD-10-CM | POA: Diagnosis not present

## 2022-01-01 ENCOUNTER — Ambulatory Visit: Payer: BC Managed Care – PPO | Admitting: Primary Care

## 2022-01-05 ENCOUNTER — Encounter: Payer: Self-pay | Admitting: Primary Care

## 2022-01-05 ENCOUNTER — Ambulatory Visit: Payer: BC Managed Care – PPO | Admitting: Primary Care

## 2022-01-05 DIAGNOSIS — R053 Chronic cough: Secondary | ICD-10-CM | POA: Diagnosis not present

## 2022-01-05 DIAGNOSIS — G4719 Other hypersomnia: Secondary | ICD-10-CM | POA: Diagnosis not present

## 2022-01-05 DIAGNOSIS — J329 Chronic sinusitis, unspecified: Secondary | ICD-10-CM | POA: Diagnosis not present

## 2022-01-05 DIAGNOSIS — J387 Other diseases of larynx: Secondary | ICD-10-CM

## 2022-01-05 NOTE — Patient Instructions (Signed)
Recommendations - Continue omeprazole '20mg'$  daily - Continue Claritin 10 mg daily - Continue to follow GERD diet - Use albuterol rescue inhaler 2 puffs every 4-6 hours as needed for shortness of breath, chest tightness or cough - If postnasal drip or cough flare, resume nasal sprays   Follow-up: As needed if cough returns/worsens

## 2022-01-05 NOTE — Progress Notes (Signed)
$'@Patient'A$  ID: Katie [name], adult    DOB: Jul 09, 1985, 36 y.o.   MRN: [id number]  Chief Complaint  Patient presents with   Follow-up    Referring provider: Fanny Bien, MD  HPI: 36 year old transgender female, legal sex is female.  Medical history significant for asthma, ADHD, endometriosis, PTSD, bipolar disorder.  Patient Dr. Chase Caller, seen for initial consult on 07/02/2021 for chronic cough.  Previous [facility] pulmonary encounter: 07/02/2021 -       Chief Complaint  Patient presents with   Consult      Pt states he has had a cough for the past 8 months. States the cough is worse at night but is also steady throughout the day. Will have some occasional wheezing. States the symptoms will come and go.    HPI Katie [name] 36 y.o. -new consult for chronic cough. Katie [name] - paddresss l In female gender "He". he tells me that he has had chronic cough since June 2022.  It is episodic.  He will have an episode the last 2 weeks every 2 months.  The cough can be quite violent and by the end of 2 weeks he gets fatigued and then has to spend a day or 2 sleeping and recovering and then he is fine till couple months later the cough..  The cough is present all day and all night.  Talking makes it worse.  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  Drinking water does make it better.  However overall no relief.  He believes he is treatment resistant.  Has not responded to antibiotics or over-the-counter medications and even prescription cough medications.  Qvar might help but only a little bit.  There is occasional wheezing.  There is also occasional shortness of breath.  Cough relevant history - Had childhood asthma but grew out of it. - Has visited [facility] and according to his history 2 months ago skin test was negative. - Does admit to postnasal drip.  Some CT [name] in the fall 2022 showed sinus thickening according to history - Is taking PPI  for possible GERD - Does clear the throat. -Also has frequent menstruation.  There is concern for endometriosis.  Is awaiting hysterectomy .  There is associated fatigue.   - is on lot of osych meds    08/06/2021 We were able to connect momentarily to video visit, he had service issues and video ended. We finished out visit by way of televisit. Patient has had a cough for 1 week with associated's rhinitis. His symptoms frequently come and go. This is the fourth time he has had bronchitis since June 2022. He states that stress will induce cough. He is currently participating in vocal rehab. He had frequent throat clearing. Associated sinus pressure and drainage. He is taking Qvar on regular basis, but states that when he is sick it actually makes his cough worse. He is also taking loratidine '10mg'$  daily and omeprazole '20mg'$  once daily. He ha not started nasal sprays that were prescribed by his PCP but plans to. He is currently also on abx/prednisone. HRCT in March 2023 showed no evidence of interstitial lung disease and CT maxillary sinuses without substantial apparent nasal sinus disease.     Chronic cough: - PND and GERD contributing. Needs PFTs to assess for obstructive lung disease  - High resolution CT chest without ILD or lung abnormality   Irritable larynx: - Referred to neuro rehab -  Plan increase omeprazole '20mg'$  to twice daily    PND/chronic sinusitis:  - CT maxillofacial showed mild mucosal thickening in the inferior maxillary sinuses  - Start Astelin and Ipratropium nasal spray as directed  - Continue loratadine '10mg'$  daily  - Refer to ENT    Childhood asthma:  - Continue Qvar 40mg for the time being, may discontinue to observe cough off ICS inhaler - Needs to reschedule PFTs and FENO to assess lung function   10/01/2021 Patient presents today for 4 to 8-week follow-up with PFTs.  Patient is doing well today without acute complaints. Cough has resolved since being on daily  antihistamine, PPI and nasal sprays.  We stopped QVAR at last visit and cough has not returned. She has no associated shortness of breath.  Patient had home sleep study in April 2023 that showed no evidence of obstructive sleep apnea.  She reports daytime fatigue.  She takes Adderall 10 mg extended release daily but will occasionally try to go without taking it on the weekends.  She gets tired around 3 PM every afternoon.  She is on several psych medications for history of bipolar disease.  [facility]  Currently tapering off Topamax.   Chronic cough - Cough has resolved since last office visit on Atrovent nasal spray, Claritin and Prilosec.  Cough felt to be due to underlying postnasal drip and reflux.  She is no longer taking Qvar.  Pulmonary function testing today showed normal lung function. FENO was 19.  Patient to contact office if cough returns.  Follow-up in 3 months with Dr. RChase Calleror sooner if needed.   Excessive daytime sleepiness - Patient has symptoms of persistent daytime fatigue.  She gets tired every day around 3 PM.  No symptoms of narcolepsy.  Patient has history of bipolar disorder.  Follows with psychiatry.  She is on several medications including Lamictal, Adderall, Seroquel and Topamax.  She reports restless sleep.  Home sleep study in April 2023 showed no evidence of obstructive sleep apnea, REM sleep accounted for 23% of total sleep time which is normal.  Recommend tapering off Topamax as this can interrupt sleep pattern/worsen insomnia symptoms.  Would also recommend increasing Adderall to either 15 or 20 mg extended release under guidance of her psychiatrist for hypersomnia.    Irritable larynx - Attending neuro/vocal rehab.  Continue omeprazole 20 mg twice daily   Sinusitis, chronic - CT sinuses showed mild mucosal thickening in the inferior maxillary sinuses - Continue Astelin and ipratropium nasal spray as directed - Continue loratadine 10  mg daily    01/05/2022- Interim hx Patient presents today for 3 month follow-up. Overall fatigue has been better but there are multiple days he is unable to stay away or will fall asleep during dinner. Fatigue symptoms seem to coincides with estrogen levels. He is working with [facility]. He is working  tapering psych medications and plans to have additional sleep studying. Cough has not been a significant issue. If he exerts his voice he may have a slight cough. He has been working on  GERD diet and has been taking omeprazole daily. PND has also really improved, worse with pollen and dust. Still taking claritin '10mg'$  daily. No longer using nasal sprays daily.   Pulmonary function testing 10/01/2021-FVC 3.06 (99%), FEV1 2.69 (105%), ratio 88, TLC 82%, DLCO 26.69 (122%)   Allergies  Allergen Reactions   Latuda [Lurasidone] Nausea And Vomiting    Projectile vomiting   Nitrofurantoin  Nausea And Vomiting and Other (See Comments)    Other reaction(s): Abdominal Pain, GI Intolerance   Pineapple Swelling    Tingling, swelling in lips    Immunization History  Administered Date(s) Administered   Influenza,inj,Quad PF,6+ Mos 01/13/2017, 01/31/2021   PFIZER(Purple Top)SARS-COV-2 Vaccination 03/18/2021   Unspecified SARS-COV-2 Vaccination 08/10/2019, 09/03/2019, 04/03/2020    Past Medical History:  Diagnosis Date   Anxiety    Asthma    Bipolar 1 disorder ([place])    Depression    Eczema    Excessive daytime sleepiness    Fibroid    Genital herpes    GERD (gastroesophageal reflux disease)    Vocal cord anomaly    Spastic vocal cords and sensitive trachea per ENT, persistent severe cough with irritation per pt    Tobacco History: Social History   Tobacco Use  Smoking Status Never  Smokeless Tobacco Never   Counseling given: Not Answered   Outpatient Medications Prior to Visit  Medication Sig Dispense Refill   acyclovir (ZOVIRAX) 400 MG tablet Take 400 mg by mouth  daily.     albuterol (VENTOLIN HFA) 108 (90 Base) MCG/ACT inhaler Inhale 2 puffs into the lungs every 4 (four) hours as needed.     amphetamine-dextroamphetamine (ADDERALL XR) 10 MG 24 hr capsule Take 10 mg by mouth daily.     Cholecalciferol (VITAMIN D3) 1.25 MG (50000 UT) CAPS Take by mouth.     diazepam (VALIUM) 2 MG tablet Take 1 mg by mouth at bedtime.     ipratropium (ATROVENT) 0.03 % nasal spray Place into both nostrils.     lamoTRIgine (LAMICTAL) 100 MG tablet Take 200 mg by mouth daily.     lithium carbonate 300 MG capsule Take 900 mg by mouth daily.     loratadine (CLARITIN) 10 MG tablet 10 mg daily.     omeprazole (PRILOSEC) 20 MG capsule Take 20 mg by mouth every morning.     Prenatal Vit-Fe Fumarate-FA (PRENATAL MULTIVITAMIN) TABS tablet Take 1 tablet by mouth at bedtime.     QUEtiapine (SEROQUEL) 25 MG tablet Take 25 mg by mouth at bedtime.     topiramate (TOPAMAX) 50 MG tablet Take 25 mg by mouth 2 (two) times daily.     valACYclovir (VALTREX) 500 MG tablet Take 500 mg by mouth daily.     docusate sodium (COLACE) 100 MG capsule Take 1 capsule (100 mg total) by mouth 2 (two) times daily. 60 capsule 2   HYDROcodone-acetaminophen (VICODIN) 5-325 mg TABS tablet Take one tablet every six hours prn pain 12 tablet 0   ibuprofen (ADVIL) 800 MG tablet Take 1 tablet (800 mg total) by mouth every 8 (eight) hours as needed. 30 tablet 0   LORazepam (ATIVAN) 0.5 MG tablet Take 0.25 mg by mouth at bedtime. Divide 0.5 mg in half (Patient not taking: Reported on 11/27/2021)     ORILISSA 150 MG TABS Take 1 tablet by mouth daily.     QVAR REDIHALER 40 MCG/ACT inhaler SMARTSIG:1-2 Puff(s) Via Inhaler Twice Daily (Patient not taking: Reported on 10/01/2021)     No facility-administered medications prior to visit.    Review of Systems  Review of Systems  Constitutional: Negative.  Negative for fatigue.  HENT:  Negative for postnasal drip.   Respiratory: Negative.  Negative for cough.      Physical Exam  BP 122/80 (BP Location: Left Arm, Cuff Size: Normal)   Pulse 84   Ht 5' 4.5" (1.638 m)   Wt 141 lb  9.6 oz (64.2 kg)   LMP 11/16/2021 (Exact Date)   SpO2 100%   BMI 23.93 kg/m  Physical Exam Constitutional:      General: He is not in acute distress.    Appearance: Normal appearance. He is not ill-appearing.  HENT:     [name]: Normocephalic and atraumatic.     Mouth/Throat:     Mouth: Mucous membranes are moist.     Pharynx: Oropharynx is clear.  Cardiovascular:     Rate and Rhythm: Normal rate and regular rhythm.  Pulmonary:     Effort: Pulmonary effort is normal.     Breath sounds: Normal breath sounds. No wheezing, rhonchi or rales.  Skin:    General: Skin is warm and dry.  Neurological:     General: No focal deficit present.     Mental Status: He is alert and oriented to person, place, and time. Mental status is at baseline.  Psychiatric:        Mood and Affect: Mood normal.        Behavior: Behavior normal.        Thought Content: Thought content normal.        Judgment: Judgment normal.      Lab Results:  CBC    Component Value Date/Time   WBC 13.8 (H) 01/08/2021 2248   RBC 4.27 01/08/2021 2248   HGB 13.6 01/08/2021 2248   HCT 43.0 01/08/2021 2248   PLT 361 01/08/2021 2248   MCV 100.7 (H) 01/08/2021 2248   MCH 31.9 01/08/2021 2248   MCHC 31.6 01/08/2021 2248   RDW 11.9 01/08/2021 2248   LYMPHSABS 2.7 01/08/2021 2248   MONOABS 1.0 01/08/2021 2248   EOSABS 0.1 01/08/2021 2248   BASOSABS 0.0 01/08/2021 2248    BMET    Component Value Date/Time   NA 140 01/08/2021 2248   K 3.6 01/08/2021 2248   CL 112 (H) 01/08/2021 2248   CO2 24 01/08/2021 2248   GLUCOSE 135 (H) 01/08/2021 2248   BUN 7 01/08/2021 2248   CREATININE 0.81 01/08/2021 2248   CREATININE 0.74 11/20/2015 0841   CALCIUM 9.1 01/08/2021 2248   GFRNONAA >60 01/08/2021 2248   GFRAA >60 10/04/2016 1243    BNP No results found for: "BNP"  ProBNP No results found for:  "PROBNP"  Imaging: No results found.   Assessment & Plan:   Chronic cough - Resolved; Cough secondary to PND and reflux. Symptoms improved with nasal spray, oral antihistamine and PPI. No longer using Qvar inhaler.   Irritable larynx - S/P neuro/vocal rehab - Continue Omeprazole '20mg'$  daily and GERD diet   Sinusitis, chronic - Stable; Continue Loratadine '10mg'$  daily  - If cough returns advised she resume Astelin and ipratropium nasal spray  Excessive daytime sleepiness - Persistent fatigue symptoms. HST in April 2023 showed no evidence of OSA. She is following with psychiatry for bipolar disorder and working on tapering several medication. She is also following with neurology and may undergo more sleep testing   Return as needed  Martyn Ehrich, NP 01/10/2022

## 2022-01-10 NOTE — Assessment & Plan Note (Addendum)
-   S/P neuro/vocal rehab - Continue Omeprazole '20mg'$  daily and GERD diet

## 2022-01-10 NOTE — Assessment & Plan Note (Signed)
-   Stable; Continue Loratadine '10mg'$  daily  - If cough returns advised she resume Astelin and ipratropium nasal spray

## 2022-01-10 NOTE — Assessment & Plan Note (Signed)
-   Resolved; Cough secondary to PND and reflux. Symptoms improved with nasal spray, oral antihistamine and PPI. No longer using Qvar inhaler.

## 2022-01-10 NOTE — Assessment & Plan Note (Addendum)
-   Persistent fatigue symptoms. HST in April 2023 showed no evidence of OSA. She is following with psychiatry for bipolar disorder and working on tapering several medication. She is also following with neurology and may undergo more sleep testing

## 2022-01-19 ENCOUNTER — Telehealth: Payer: Self-pay | Admitting: Neurology

## 2022-01-19 ENCOUNTER — Encounter: Payer: Self-pay | Admitting: Neurology

## 2022-01-19 NOTE — Telephone Encounter (Signed)
I called pt, I relayed the information that she needs to be off the medications listed below for 2 wks so as to get her sleep studies.  She says she is not on xanax (diazepam replaced that) the lamotrigine, lithium she could not get off of, the topiramate and adderall she would have to contact her psychiatrist.  I told her that would relay back but did reiterate that she would need to be off all for 2wks to check her sleep and give more accurate diagnosis.   Once she finds out about these medications per her psychiatrist then will let us know.

## 2022-01-19 NOTE — Telephone Encounter (Signed)
Please see my chart message, patient reports coming off of Valium and Seroquel.  Please clarify that patient understands that they would have to be off Xanax, lamotrigine, lithium, Seroquel, topiramate, and Adderall, for a total of at least 2 weeks prior to our sleep testing.  Please clarify again with them. We can put him on the schedule for overnight sleep testing with next day MSLT.  I will copy Meagan in the sleep lab on this for scheduling purposes once patient is ready to proceed.

## 2022-02-01 DIAGNOSIS — F319 Bipolar disorder, unspecified: Secondary | ICD-10-CM | POA: Diagnosis not present

## 2022-02-01 DIAGNOSIS — F900 Attention-deficit hyperactivity disorder, predominantly inattentive type: Secondary | ICD-10-CM | POA: Diagnosis not present

## 2022-02-01 DIAGNOSIS — F411 Generalized anxiety disorder: Secondary | ICD-10-CM | POA: Diagnosis not present

## 2022-02-01 DIAGNOSIS — F640 Transsexualism: Secondary | ICD-10-CM | POA: Diagnosis not present

## 2022-02-19 DIAGNOSIS — F3176 Bipolar disorder, in full remission, most recent episode depressed: Secondary | ICD-10-CM | POA: Diagnosis not present

## 2022-02-26 DIAGNOSIS — F3176 Bipolar disorder, in full remission, most recent episode depressed: Secondary | ICD-10-CM | POA: Diagnosis not present

## 2022-03-04 DIAGNOSIS — J069 Acute upper respiratory infection, unspecified: Secondary | ICD-10-CM | POA: Diagnosis not present

## 2022-03-04 DIAGNOSIS — B9689 Other specified bacterial agents as the cause of diseases classified elsewhere: Secondary | ICD-10-CM | POA: Diagnosis not present

## 2022-03-04 DIAGNOSIS — J329 Chronic sinusitis, unspecified: Secondary | ICD-10-CM | POA: Diagnosis not present

## 2022-03-05 DIAGNOSIS — F3176 Bipolar disorder, in full remission, most recent episode depressed: Secondary | ICD-10-CM | POA: Diagnosis not present

## 2022-03-09 DIAGNOSIS — F649 Gender identity disorder, unspecified: Secondary | ICD-10-CM | POA: Diagnosis not present

## 2022-03-09 DIAGNOSIS — Z23 Encounter for immunization: Secondary | ICD-10-CM | POA: Diagnosis not present

## 2022-03-09 DIAGNOSIS — F640 Transsexualism: Secondary | ICD-10-CM | POA: Diagnosis not present

## 2022-03-09 DIAGNOSIS — R053 Chronic cough: Secondary | ICD-10-CM | POA: Diagnosis not present

## 2022-03-09 DIAGNOSIS — F3162 Bipolar disorder, current episode mixed, moderate: Secondary | ICD-10-CM | POA: Diagnosis not present

## 2022-03-09 DIAGNOSIS — Z79899 Other long term (current) drug therapy: Secondary | ICD-10-CM | POA: Diagnosis not present

## 2022-03-11 DIAGNOSIS — Z79899 Other long term (current) drug therapy: Secondary | ICD-10-CM | POA: Insufficient documentation

## 2022-03-11 DIAGNOSIS — F649 Gender identity disorder, unspecified: Secondary | ICD-10-CM | POA: Insufficient documentation

## 2022-03-11 DIAGNOSIS — G4710 Hypersomnia, unspecified: Secondary | ICD-10-CM | POA: Insufficient documentation

## 2022-03-12 DIAGNOSIS — F3176 Bipolar disorder, in full remission, most recent episode depressed: Secondary | ICD-10-CM | POA: Diagnosis not present

## 2022-03-16 DIAGNOSIS — G4710 Hypersomnia, unspecified: Secondary | ICD-10-CM | POA: Diagnosis not present

## 2022-03-16 DIAGNOSIS — J45909 Unspecified asthma, uncomplicated: Secondary | ICD-10-CM | POA: Diagnosis not present

## 2022-03-16 DIAGNOSIS — G4750 Parasomnia, unspecified: Secondary | ICD-10-CM | POA: Diagnosis not present

## 2022-03-16 DIAGNOSIS — F902 Attention-deficit hyperactivity disorder, combined type: Secondary | ICD-10-CM | POA: Diagnosis not present

## 2022-03-26 DIAGNOSIS — F3176 Bipolar disorder, in full remission, most recent episode depressed: Secondary | ICD-10-CM | POA: Diagnosis not present

## 2022-03-31 DIAGNOSIS — R053 Chronic cough: Secondary | ICD-10-CM | POA: Diagnosis not present

## 2022-03-31 DIAGNOSIS — R768 Other specified abnormal immunological findings in serum: Secondary | ICD-10-CM | POA: Diagnosis not present

## 2022-03-31 DIAGNOSIS — J385 Laryngeal spasm: Secondary | ICD-10-CM | POA: Diagnosis not present

## 2022-03-31 DIAGNOSIS — R631 Polydipsia: Secondary | ICD-10-CM | POA: Diagnosis not present

## 2022-04-02 DIAGNOSIS — J209 Acute bronchitis, unspecified: Secondary | ICD-10-CM | POA: Diagnosis not present

## 2022-04-02 DIAGNOSIS — Z87898 Personal history of other specified conditions: Secondary | ICD-10-CM | POA: Diagnosis not present

## 2022-04-02 DIAGNOSIS — J45909 Unspecified asthma, uncomplicated: Secondary | ICD-10-CM | POA: Diagnosis not present

## 2022-04-02 DIAGNOSIS — J069 Acute upper respiratory infection, unspecified: Secondary | ICD-10-CM | POA: Diagnosis not present

## 2022-04-07 DIAGNOSIS — J385 Laryngeal spasm: Secondary | ICD-10-CM | POA: Diagnosis not present

## 2022-04-07 DIAGNOSIS — R631 Polydipsia: Secondary | ICD-10-CM | POA: Diagnosis not present

## 2022-04-07 DIAGNOSIS — Z79899 Other long term (current) drug therapy: Secondary | ICD-10-CM | POA: Diagnosis not present

## 2022-04-07 DIAGNOSIS — R768 Other specified abnormal immunological findings in serum: Secondary | ICD-10-CM | POA: Diagnosis not present

## 2022-04-08 DIAGNOSIS — R0683 Snoring: Secondary | ICD-10-CM | POA: Diagnosis not present

## 2022-04-08 DIAGNOSIS — B9689 Other specified bacterial agents as the cause of diseases classified elsewhere: Secondary | ICD-10-CM | POA: Diagnosis not present

## 2022-04-08 DIAGNOSIS — R030 Elevated blood-pressure reading, without diagnosis of hypertension: Secondary | ICD-10-CM | POA: Diagnosis not present

## 2022-04-08 DIAGNOSIS — J22 Unspecified acute lower respiratory infection: Secondary | ICD-10-CM | POA: Diagnosis not present

## 2022-04-08 DIAGNOSIS — G4752 REM sleep behavior disorder: Secondary | ICD-10-CM | POA: Diagnosis not present

## 2022-04-09 ENCOUNTER — Other Ambulatory Visit: Payer: Self-pay | Admitting: Family Medicine

## 2022-04-09 ENCOUNTER — Ambulatory Visit
Admission: RE | Admit: 2022-04-09 | Discharge: 2022-04-09 | Disposition: A | Discharge status: Home / Self Care | Payer: BC Managed Care – PPO | Source: Ambulatory Visit | Attending: Family Medicine | Admitting: Family Medicine

## 2022-04-09 DIAGNOSIS — R058 Other specified cough: Secondary | ICD-10-CM

## 2022-04-09 DIAGNOSIS — Z0000 Encounter for general adult medical examination without abnormal findings: Secondary | ICD-10-CM | POA: Diagnosis not present

## 2022-04-09 DIAGNOSIS — R059 Cough, unspecified: Secondary | ICD-10-CM | POA: Diagnosis not present

## 2022-04-09 DIAGNOSIS — R7989 Other specified abnormal findings of blood chemistry: Secondary | ICD-10-CM | POA: Diagnosis not present

## 2022-04-09 DIAGNOSIS — F3176 Bipolar disorder, in full remission, most recent episode depressed: Secondary | ICD-10-CM | POA: Diagnosis not present

## 2022-04-12 DIAGNOSIS — Z803 Family history of malignant neoplasm of breast: Secondary | ICD-10-CM | POA: Diagnosis not present

## 2022-04-12 DIAGNOSIS — R7989 Other specified abnormal findings of blood chemistry: Secondary | ICD-10-CM | POA: Diagnosis not present

## 2022-04-12 DIAGNOSIS — I10 Essential (primary) hypertension: Secondary | ICD-10-CM | POA: Diagnosis not present

## 2022-04-13 ENCOUNTER — Other Ambulatory Visit: Payer: Self-pay | Admitting: Family Medicine

## 2022-04-13 DIAGNOSIS — F902 Attention-deficit hyperactivity disorder, combined type: Secondary | ICD-10-CM | POA: Diagnosis not present

## 2022-04-13 DIAGNOSIS — K219 Gastro-esophageal reflux disease without esophagitis: Secondary | ICD-10-CM | POA: Diagnosis not present

## 2022-04-13 DIAGNOSIS — Z803 Family history of malignant neoplasm of breast: Secondary | ICD-10-CM

## 2022-04-13 DIAGNOSIS — R0683 Snoring: Secondary | ICD-10-CM | POA: Diagnosis not present

## 2022-04-13 DIAGNOSIS — J45909 Unspecified asthma, uncomplicated: Secondary | ICD-10-CM | POA: Diagnosis not present

## 2022-04-15 DIAGNOSIS — D72829 Elevated white blood cell count, unspecified: Secondary | ICD-10-CM | POA: Diagnosis not present

## 2022-04-15 DIAGNOSIS — R7989 Other specified abnormal findings of blood chemistry: Secondary | ICD-10-CM | POA: Diagnosis not present

## 2022-04-19 DIAGNOSIS — N390 Urinary tract infection, site not specified: Secondary | ICD-10-CM | POA: Diagnosis not present

## 2022-04-19 DIAGNOSIS — F640 Transsexualism: Secondary | ICD-10-CM | POA: Diagnosis not present

## 2022-04-19 DIAGNOSIS — I10 Essential (primary) hypertension: Secondary | ICD-10-CM | POA: Diagnosis not present

## 2022-04-19 DIAGNOSIS — N3000 Acute cystitis without hematuria: Secondary | ICD-10-CM | POA: Diagnosis not present

## 2022-04-19 DIAGNOSIS — D72829 Elevated white blood cell count, unspecified: Secondary | ICD-10-CM | POA: Diagnosis not present

## 2022-04-23 ENCOUNTER — Encounter: Payer: Self-pay | Admitting: Cardiology

## 2022-04-23 ENCOUNTER — Ambulatory Visit: Payer: BC Managed Care – PPO | Admitting: Cardiology

## 2022-04-23 VITALS — BP 147/81 | HR 88 | Resp 16 | Ht 64.5 in | Wt 142.2 lb

## 2022-04-23 DIAGNOSIS — F32A Depression, unspecified: Secondary | ICD-10-CM

## 2022-04-23 DIAGNOSIS — R0602 Shortness of breath: Secondary | ICD-10-CM | POA: Diagnosis not present

## 2022-04-23 DIAGNOSIS — R072 Precordial pain: Secondary | ICD-10-CM

## 2022-04-23 DIAGNOSIS — F909 Attention-deficit hyperactivity disorder, unspecified type: Secondary | ICD-10-CM

## 2022-04-23 DIAGNOSIS — R002 Palpitations: Secondary | ICD-10-CM | POA: Diagnosis not present

## 2022-04-23 DIAGNOSIS — F319 Bipolar disorder, unspecified: Secondary | ICD-10-CM

## 2022-04-23 DIAGNOSIS — R053 Chronic cough: Secondary | ICD-10-CM

## 2022-04-23 NOTE — Progress Notes (Signed)
ID:  Katie [name], DOB 1986-02-09, MRN [id number]  PCP:  Fanny Bien, MD  Cardiologist:  Rex Kras, DO, [facility] (established care 04/23/2022)  REASON FOR CONSULT: Palpitations /elevated blood pressure/clearance to continue psychotropic medications  REQUESTING PHYSICIAN:  Fanny Bien, MD [address] [place],  [place] [address]  Chief Complaint  Patient presents with   New Patient (Initial Visit)   Chest Pain   Shortness of Breath    HPI  Katie [name] is a 36 y.o.  adult who presents to the clinic for evaluation of palpitations/elevated blood pressures/clearance to continue psychotropic medications at the request of Fanny Bien, MD. His past medical history and cardiovascular risk factors include: Bipolar disorder, generalized anxiety disorder, psychogenic vocal cord dysfunction, depression, ADHD.   Patient was referred to the practice for evaluation of palpitations, elevated blood pressures, chest pain/shortness of breath.  Patient states that he is currently being worked up for autoimmune disease possibly given her recurrent cough.  Recent blood work concerning for elevated WBC and BNP level.  Patient did undergo chest x-ray which was within normal limits according to patient.  Palpitations: Symptoms started when he was in his 64s, occurs once every couple months, lasting for few seconds, no associated near-syncope or syncope.  No significant caffeine intake or identifiable reversible causes.  Chest pain/shortness of breath: Noticeable as of December 2023.  Located substernally, associated with a cough, sharp, 4 out of 10 in intensity, duration less than 5 minutes, self-limited.  Symptoms are now brought on by effort related activities and did not resolve with rest.  He denies orthopnea, PND, lower extremity swelling.  No family history of premature coronary disease or sudden cardiac death.  FUNCTIONAL STATUS: No structured exercise program or daily  routine.   ALLERGIES: Allergies  Allergen Reactions   Latuda [Lurasidone] Nausea And Vomiting    Projectile vomiting   Nitrofurantoin Nausea And Vomiting and Other (See Comments)    Other reaction(s): Abdominal Pain, GI Intolerance   Pineapple Swelling    Tingling, swelling in lips    MEDICATION LIST PRIOR TO VISIT: Current Meds  Medication Sig   acyclovir (ZOVIRAX) 400 MG tablet Take 400 mg by mouth daily.   albuterol (VENTOLIN HFA) 108 (90 Base) MCG/ACT inhaler Inhale 2 puffs into the lungs every 4 (four) hours as needed.   amphetamine-dextroamphetamine (ADDERALL XR) 10 MG 24 hr capsule Take 10 mg by mouth daily.   Cholecalciferol (VITAMIN D3) 1.25 MG (50000 UT) CAPS Take by mouth.   diazepam (VALIUM) 2 MG tablet Take 1 mg by mouth at bedtime.   lamoTRIgine (LAMICTAL) 100 MG tablet Take 200 mg by mouth daily.   lithium carbonate 300 MG capsule Take 900 mg by mouth daily.   loratadine (CLARITIN) 10 MG tablet 10 mg daily.   omeprazole (PRILOSEC) 20 MG capsule Take 20 mg by mouth every morning.   QUEtiapine (SEROQUEL) 25 MG tablet Take 25 mg by mouth at bedtime.   testosterone cypionate (DEPOTESTOSTERONE CYPIONATE) 200 MG/ML injection Inject 50 mg into the muscle every 7 (seven) days.   topiramate (TOPAMAX) 50 MG tablet Take 25 mg by mouth 2 (two) times daily.   valACYclovir (VALTREX) 500 MG tablet Take 500 mg by mouth daily.     PAST MEDICAL HISTORY: Past Medical History:  Diagnosis Date   Anxiety    Asthma    Bipolar 1 disorder ([place])    Depression    Eczema    Excessive daytime sleepiness    Fibroid  Genital herpes    GERD (gastroesophageal reflux disease)    Vocal cord anomaly    Spastic vocal cords and sensitive trachea per ENT, persistent severe cough with irritation per pt    PAST SURGICAL HISTORY: Past Surgical History:  Procedure Laterality Date   DILITATION & CURRETTAGE/HYSTROSCOPY WITH NOVASURE ABLATION N/A 12/07/2021   Procedure: DILATATION &  CURETTAGE/HYSTEROSCOPY WITH NOVASURE ABLATION;  Surgeon: Tyson Dense, MD;  Location: [place];  Service: Gynecology;  Laterality: N/A;   IUD REMOVAL N/A 12/07/2021   Procedure: INTRAUTERINE DEVICE (IUD) REMOVAL;  Surgeon: Tyson Dense, MD;  Location: [facility];  Service: Gynecology;  Laterality: N/A;   LAPAROSCOPIC TUBAL LIGATION Bilateral 12/07/2021   Procedure: LAPAROSCOPIC BILATERAL TUBAL LIGATION WITH CAUTERY;  Surgeon: Tyson Dense, MD;  Location: [facility];  Service: Gynecology;  Laterality: Bilateral;   NO PAST SURGERIES     OVARIAN CYST REMOVAL N/A 12/07/2021   Procedure: FULGURATION OF ENDOMETRIOSIS;  Surgeon: Tyson Dense, MD;  Location: [facility];  Service: Gynecology;  Laterality: N/A;    FAMILY HISTORY: The patient family history includes Arrhythmia in his maternal grandmother and mother; Arthritis/Rheumatoid in his sister; Diabetes in his paternal grandfather and paternal grandmother; Hypertension in his father, paternal grandfather, and paternal grandmother; Mitral valve prolapse in his brother.  SOCIAL HISTORY:  The patient  reports that he has never smoked. He has never used smokeless tobacco. He reports that he does not currently use alcohol. He reports that he does not use drugs.  REVIEW OF SYSTEMS: Review of Systems  Cardiovascular:  Positive for chest pain (see HPI) and palpitations. Negative for claudication, dyspnea on exertion, irregular heartbeat, leg swelling, near-syncope, orthopnea, paroxysmal nocturnal dyspnea and syncope.  Respiratory:  Positive for shortness of breath (see HPI).   Hematologic/Lymphatic: Negative for bleeding problem.  Musculoskeletal:  Negative for muscle cramps and myalgias.  Neurological:  Negative for dizziness and light-headedness.   PHYSICAL EXAM:    04/23/2022   11:08 AM 01/05/2022   10:57 AM 12/07/2021   10:23 AM  Vitals with BMI  Height  5' 4.5" 5' 4.5"   Weight 142 lbs 3 oz 141 lbs 10 oz   BMI 06.30 16.01   Systolic 093 235 573  Diastolic 81 80 76  Pulse 88 84 65    Physical Exam  Constitutional: No distress.  Age appropriate, hemodynamically stable.   Neck: No JVD present.  Cardiovascular: Normal rate, regular rhythm, S1 normal, S2 normal, intact distal pulses and normal pulses. Exam reveals no gallop, no S3 and no S4.  No murmur heard. Pulmonary/Chest: Effort normal and breath sounds normal. No stridor. He has no wheezes. He has no rales.  Abdominal: Soft. Bowel sounds are normal. He exhibits no distension. There is no abdominal tenderness.  Musculoskeletal:        General: No edema.     Cervical back: Neck supple.  Neurological: He is alert and oriented to person, place, and time. He has intact cranial nerves (2-12).  Skin: Skin is warm and moist.   CARDIAC DATABASE: EKG: 04/23/2022: Normal sinus rhythm, 74 bpm,IRBBB.   Echocardiogram: No results found for this or any previous visit from the past 1095 days.    Stress Testing: No results found for this or any previous visit from the past 1095 days.   Heart Catheterization: None  LABORATORY DATA:    Latest Ref Rng & Units 01/08/2021   10:48 PM 01/20/2017  6:00 AM 01/19/2017    1:30 AM  CBC  WBC 4.0 - 10.5 K/uL 13.8  21.3  12.1   Hemoglobin 12.0 - 15.0 g/dL 13.6  11.1  13.1   Hematocrit 36.0 - 46.0 % 43.0  32.9  38.2   Platelets 150 - 400 K/uL 361  275  317        Latest Ref Rng & Units 01/08/2021   10:48 PM 10/04/2016   12:43 PM 11/20/2015    8:41 AM  CMP  Glucose 70 - 99 mg/dL 135  107  93   BUN 6 - 20 mg/dL '7  7  10   '$ Creatinine 0.44 - 1.00 mg/dL 0.81  0.42  0.74   Sodium 135 - 145 mmol/L 140  134  137   Potassium 3.5 - 5.1 mmol/L 3.6  4.3  4.6   Chloride 98 - 111 mmol/L 112  103  104   CO2 22 - 32 mmol/L '24  21  26   '$ Calcium 8.9 - 10.3 mg/dL 9.1  8.8  8.9   Total Protein 6.5 - 8.1 g/dL 6.9  6.2  6.7   Total Bilirubin 0.3 - 1.2 mg/dL 0.4   0.5  0.3   Alkaline Phos 38 - 126 U/L 75  57  63   AST 15 - 41 U/L 18  36  13   ALT 0 - 44 U/L '15  29  9     '$ Lipid Panel     Component Value Date/Time   CHOL 160 11/20/2015 0841   TRIG 77 11/20/2015 0841   HDL 56 11/20/2015 0841   CHOLHDL 2.9 11/20/2015 0841   VLDL 15 11/20/2015 0841   LDLCALC 89 11/20/2015 0841    No components found for: "NTPROBNP" No results for input(s): "PROBNP" in the last 8760 hours. No results for input(s): "TSH" in the last 8760 hours.  BMP No results for input(s): "NA", "K", "CL", "CO2", "GLUCOSE", "BUN", "CREATININE", "CALCIUM", "GFRNONAA", "GFRAA" in the last 8760 hours.  HEMOGLOBIN A1C No results found for: "HGBA1C", "MPG"  External Labs: Collected: 04/12/2022 provided by referring physician. BNP 156.9 (elevated) TSH 0.58. Lithium 0.9 (within normal limits) Total cholesterol 170, triglycerides 78, LDL 92, HDL 62. White count 24.4. Hemoglobin 13.6, hematocrit 42.5%. Platelets 405. Sodium 139, potassium 4.5, chloride 104, bicarb 26. BUN 10, creatinine 0.9. AST 15, ALT 14, alkaline phosphatase 73  IMPRESSION:    ICD-10-CM   1. Palpitations  R00.2 EKG 12-Lead    2. Shortness of breath  R06.02 PCV ECHOCARDIOGRAM COMPLETE    PCV CARDIAC STRESS TEST    3. Precordial pain  R07.2 PCV ECHOCARDIOGRAM COMPLETE    PCV CARDIAC STRESS TEST    4. Chronic cough  R05.3     5. Bipolar 1 disorder (HCC)  F31.9     6. Anxiety and depression  F41.9    F32.A     7. Attention deficit hyperactivity disorder (ADHD), unspecified ADHD type  F90.9        RECOMMENDATIONS: Pauline Pegues is a 36 y.o.  adult whose past medical history and cardiac risk factors include: Bipolar disorder, generalized anxiety disorder, psychogenic vocal cord dysfunction, depression, ADHD.   Palpitations Symptoms are quite infrequent and short lasting. Overall diagnostic yield for a cardiac monitor is low  No identifiable reversible causes.  Shortness of  breath Clinically euvolemic and not in congestive heart failure. BNP elevated -isolated finding. Chest x-ray from 04/09/2022 heart normal size, no vascular congestion. Echo will be ordered to  evaluate for structural heart disease and left ventricular systolic function.  Precordial pain Symptoms are noncardiac.   Overall functional capacity remains stable. On multiple psychotropic medications and therefore the shared decision was to proceed with exercise treadmill stress test for further risk stratification.  Chronic cough Currently being worked up by PCP and awaiting a consultation with pulmonary medicine  I have been asked to provide clearance for psychotropic medications.  He is currently on multiple psychotropic medications which include but not limited to  Adderall XR, Valium, Lamictal, lithium, Seroquel, Topamax.  Which predisposes him to multiple drug to drug interactions that can lead to symptoms of serotonin syndrome, elevated blood pressures, and decreasing threshold for seizures.  I have asked him to discuss down titration of such medications under medical supervision with his prescribing provider to the lowest effective dose and shortest duration of concomitant medications. He verbalizes understanding.   Patient was started on valsartan for blood pressure management by PCP will defer to their expertise.  Data Reviewed: I have independently reviewed external notes provided by the referring provider as part of this office visit.   I have independently reviewed results of labs, chest xray, EKG, as part of medical decision making. I have ordered the following tests:  Orders Placed This Encounter  Procedures   PCV CARDIAC STRESS TEST    Standing Status:   Future    Standing Expiration Date:   04/24/2023   EKG 12-Lead   PCV ECHOCARDIOGRAM COMPLETE    Standing Status:   Future    Standing Expiration Date:   04/24/2023  I have made no medications changes at today's encounter as noted  above.  FINAL MEDICATION LIST END OF ENCOUNTER: No orders of the defined types were placed in this encounter.   Medications Discontinued During This Encounter  Medication Reason   ipratropium (ATROVENT) 0.03 % nasal spray    Prenatal Vit-Fe Fumarate-FA (PRENATAL MULTIVITAMIN) TABS tablet Patient Preference     Current Outpatient Medications:    acyclovir (ZOVIRAX) 400 MG tablet, Take 400 mg by mouth daily., Disp: , Rfl:    albuterol (VENTOLIN HFA) 108 (90 Base) MCG/ACT inhaler, Inhale 2 puffs into the lungs every 4 (four) hours as needed., Disp: , Rfl:    amphetamine-dextroamphetamine (ADDERALL XR) 10 MG 24 hr capsule, Take 10 mg by mouth daily., Disp: , Rfl:    Cholecalciferol (VITAMIN D3) 1.25 MG (50000 UT) CAPS, Take by mouth., Disp: , Rfl:    diazepam (VALIUM) 2 MG tablet, Take 1 mg by mouth at bedtime., Disp: , Rfl:    lamoTRIgine (LAMICTAL) 100 MG tablet, Take 200 mg by mouth daily., Disp: , Rfl:    lithium carbonate 300 MG capsule, Take 900 mg by mouth daily., Disp: , Rfl:    loratadine (CLARITIN) 10 MG tablet, 10 mg daily., Disp: , Rfl:    omeprazole (PRILOSEC) 20 MG capsule, Take 20 mg by mouth every morning., Disp: , Rfl:    QUEtiapine (SEROQUEL) 25 MG tablet, Take 25 mg by mouth at bedtime., Disp: , Rfl:    testosterone cypionate (DEPOTESTOSTERONE CYPIONATE) 200 MG/ML injection, Inject 50 mg into the muscle every 7 (seven) days., Disp: , Rfl:    topiramate (TOPAMAX) 50 MG tablet, Take 25 mg by mouth 2 (two) times daily., Disp: , Rfl:    valACYclovir (VALTREX) 500 MG tablet, Take 500 mg by mouth daily., Disp: , Rfl:   Orders Placed This Encounter  Procedures   PCV CARDIAC STRESS TEST   EKG 12-Lead  PCV ECHOCARDIOGRAM COMPLETE    There are no Patient Instructions on file for this visit.   --Continue cardiac medications as reconciled in final medication list. --Return in about 6 weeks (around 06/04/2022) for Follow up, Review test results. or sooner if needed. --Continue  follow-up with your primary care physician regarding the management of your other chronic comorbid conditions.  Patient's questions and concerns were addressed to his satisfaction. He voices understanding of the instructions provided during this encounter.   This note was created using a voice recognition software as a result there may be grammatical errors inadvertently enclosed that do not reflect the nature of this encounter. Every attempt is made to correct such errors.  Rex Kras, [place], [facility]  Pager: [phone] Office: [phone]

## 2022-04-30 DIAGNOSIS — D72829 Elevated white blood cell count, unspecified: Secondary | ICD-10-CM | POA: Diagnosis not present

## 2022-04-30 DIAGNOSIS — F640 Transsexualism: Secondary | ICD-10-CM | POA: Diagnosis not present

## 2022-04-30 DIAGNOSIS — R7989 Other specified abnormal findings of blood chemistry: Secondary | ICD-10-CM | POA: Diagnosis not present

## 2022-04-30 DIAGNOSIS — F319 Bipolar disorder, unspecified: Secondary | ICD-10-CM | POA: Diagnosis not present

## 2022-05-04 DIAGNOSIS — F900 Attention-deficit hyperactivity disorder, predominantly inattentive type: Secondary | ICD-10-CM | POA: Diagnosis not present

## 2022-05-04 DIAGNOSIS — F411 Generalized anxiety disorder: Secondary | ICD-10-CM | POA: Diagnosis not present

## 2022-05-04 DIAGNOSIS — F319 Bipolar disorder, unspecified: Secondary | ICD-10-CM | POA: Diagnosis not present

## 2022-05-04 DIAGNOSIS — F649 Gender identity disorder, unspecified: Secondary | ICD-10-CM | POA: Diagnosis not present

## 2022-05-07 DIAGNOSIS — Z79899 Other long term (current) drug therapy: Secondary | ICD-10-CM | POA: Diagnosis not present

## 2022-05-07 DIAGNOSIS — F3171 Bipolar disorder, in partial remission, most recent episode hypomanic: Secondary | ICD-10-CM | POA: Diagnosis not present

## 2022-05-11 ENCOUNTER — Ambulatory Visit: Payer: BC Managed Care – PPO

## 2022-05-11 DIAGNOSIS — R072 Precordial pain: Secondary | ICD-10-CM | POA: Diagnosis not present

## 2022-05-11 DIAGNOSIS — R0602 Shortness of breath: Secondary | ICD-10-CM

## 2022-05-14 ENCOUNTER — Emergency Department (HOSPITAL_COMMUNITY): Payer: BC Managed Care – PPO

## 2022-05-14 ENCOUNTER — Emergency Department (HOSPITAL_COMMUNITY)
Admission: EM | Admit: 2022-05-14 | Discharge: 2022-05-14 | Disposition: A | Discharge status: Home / Self Care | Payer: BC Managed Care – PPO | Attending: Emergency Medicine | Admitting: Emergency Medicine

## 2022-05-14 ENCOUNTER — Encounter (HOSPITAL_COMMUNITY): Payer: Self-pay

## 2022-05-14 ENCOUNTER — Other Ambulatory Visit: Payer: Self-pay

## 2022-05-14 DIAGNOSIS — T50905A Adverse effect of unspecified drugs, medicaments and biological substances, initial encounter: Secondary | ICD-10-CM | POA: Insufficient documentation

## 2022-05-14 DIAGNOSIS — I10 Essential (primary) hypertension: Secondary | ICD-10-CM | POA: Diagnosis not present

## 2022-05-14 DIAGNOSIS — R42 Dizziness and giddiness: Secondary | ICD-10-CM | POA: Insufficient documentation

## 2022-05-14 DIAGNOSIS — R630 Anorexia: Secondary | ICD-10-CM | POA: Diagnosis not present

## 2022-05-14 DIAGNOSIS — R531 Weakness: Secondary | ICD-10-CM | POA: Diagnosis not present

## 2022-05-14 DIAGNOSIS — T887XXA Unspecified adverse effect of drug or medicament, initial encounter: Secondary | ICD-10-CM | POA: Diagnosis not present

## 2022-05-14 DIAGNOSIS — R519 Headache, unspecified: Secondary | ICD-10-CM | POA: Insufficient documentation

## 2022-05-14 LAB — URINALYSIS, ROUTINE W REFLEX MICROSCOPIC
Bacteria, UA: NONE SEEN
Bilirubin Urine: NEGATIVE
Glucose, UA: NEGATIVE mg/dL
Ketones, ur: NEGATIVE mg/dL
Nitrite: NEGATIVE
Protein, ur: NEGATIVE mg/dL
Specific Gravity, Urine: 1.014 (ref 1.005–1.030)
pH: 6 (ref 5.0–8.0)

## 2022-05-14 LAB — CBC WITH DIFFERENTIAL/PLATELET
Abs Immature Granulocytes: 0.03 10*3/uL (ref 0.00–0.07)
Basophils Absolute: 0.1 10*3/uL (ref 0.0–0.1)
Basophils Relative: 1 %
Eosinophils Absolute: 0.1 10*3/uL (ref 0.0–0.5)
Eosinophils Relative: 1 %
HCT: 45 % (ref 36.0–46.0)
Hemoglobin: 14.4 g/dL (ref 12.0–15.0)
Immature Granulocytes: 0 %
Lymphocytes Relative: 27 %
Lymphs Abs: 2.7 10*3/uL (ref 0.7–4.0)
MCH: 32.1 pg (ref 26.0–34.0)
MCHC: 32 g/dL (ref 30.0–36.0)
MCV: 100.4 fL — ABNORMAL HIGH (ref 80.0–100.0)
Monocytes Absolute: 0.5 10*3/uL (ref 0.1–1.0)
Monocytes Relative: 5 %
Neutro Abs: 6.7 10*3/uL (ref 1.7–7.7)
Neutrophils Relative %: 66 %
Platelets: 389 10*3/uL (ref 150–400)
RBC: 4.48 MIL/uL (ref 3.87–5.11)
RDW: 12.3 % (ref 11.5–15.5)
WBC: 10 10*3/uL (ref 4.0–10.5)
nRBC: 0 % (ref 0.0–0.2)

## 2022-05-14 LAB — BASIC METABOLIC PANEL
Anion gap: 6 (ref 5–15)
BUN: 11 mg/dL (ref 6–20)
CO2: 24 mmol/L (ref 22–32)
Calcium: 8.8 mg/dL — ABNORMAL LOW (ref 8.9–10.3)
Chloride: 107 mmol/L (ref 98–111)
Creatinine, Ser: 0.86 mg/dL (ref 0.44–1.00)
GFR, Estimated: >60 mL/min (ref >60)
Glucose, Bld: 106 mg/dL — ABNORMAL HIGH (ref 70–99)
Potassium: 3.8 mmol/L (ref 3.5–5.1)
Sodium: 137 mmol/L (ref 135–145)

## 2022-05-14 LAB — LITHIUM LEVEL: Lithium Lvl: <0.06 mmol/L — ABNORMAL LOW (ref 0.60–1.20)

## 2022-05-14 MED ORDER — ACETAMINOPHEN 500 MG PO TABS: 1000.0000 mg | ORAL_TABLET | Freq: Once | ORAL | Status: DC

## 2022-05-14 MED ORDER — KETOROLAC TROMETHAMINE 15 MG/ML IJ SOLN: 15.0000 mg | Freq: Once | INTRAMUSCULAR | Status: DC

## 2022-05-14 MED ORDER — LACTATED RINGERS IV BOLUS: 1000.0000 mL | Freq: Once | INTRAVENOUS | Status: DC

## 2022-05-14 NOTE — ED Provider Notes (Signed)
[facility] DEPT Provider Note   CSN: [id number] Arrival date & time: 05/14/22  1658     History {Add pertinent medical, surgical, social history, OB history to HPI:1} Chief Complaint  Patient presents with   Medication Reaction    Katie [name] is a 37 y.o. adult genetically female transgender female with HTN, bipolar disorder, PTSD, endometriosis, ADHD presents with concern for medication reaction.   Pt complains of medication reaction.  Patient reports that he has been managed for his bipolar disorder with lithium recently but thinks that she may be developing diabetes insipidus and so they were moving to decrease his dose.  Recently had his lithium dose decreased and increased her dose of nighttime Seroquel.  Increased four days ago from 25 mg to 50 mg to 75 mg.  Patient states he went to sleep feeling "black out drunk" and slurring words at home after taking first dose of 75 mg PO seroquel last night. Woke up feeling the same way this morning. States symptoms went away for approximately 1.5 hours but then returned. Felt flushed and hot as well, and lightheaded. Temperature was normal at that time. BP was 145/85, concerned about high blood pressure, and came here. Denies CP, abd pain, N/V/D/C, urinary or vaginal symptoms, leg swelling. No coingestions. Denies drug/alcohol use.  Endorses poor p.o. intake over the last day as well as a mild headache with no vision changes.  HPI     Home Medications Prior to Admission medications   Medication Sig Start Date End Date Taking? Authorizing Provider  acyclovir (ZOVIRAX) 400 MG tablet Take 400 mg by mouth daily. 04/22/21   [provider]  albuterol (VENTOLIN HFA) 108 (90 Base) MCG/ACT inhaler Inhale 2 puffs into the lungs every 4 (four) hours as needed. 04/01/21   [provider]  amphetamine-dextroamphetamine (ADDERALL XR) 10 MG 24 hr capsule Take 10 mg by mouth daily. 06/18/21   [provider]  Cholecalciferol (VITAMIN D3) 1.25 MG (50000 UT) CAPS Take by mouth.    [provider]  diazepam (VALIUM) 2 MG tablet Take 1 mg by mouth at bedtime.    [provider]  lamoTRIgine (LAMICTAL) 100 MG tablet Take 200 mg by mouth daily.    [provider]  lithium carbonate 300 MG capsule Take 900 mg by mouth daily.    [provider]  loratadine (CLARITIN) 10 MG tablet 10 mg daily.    [provider]  omeprazole (PRILOSEC) 20 MG capsule Take 20 mg by mouth every morning. 07/29/21   [provider]  QUEtiapine (SEROQUEL) 25 MG tablet Take 25 mg by mouth at bedtime. 01/06/21   [provider]  testosterone cypionate (DEPOTESTOSTERONE CYPIONATE) 200 MG/ML injection Inject 50 mg into the muscle every 7 (seven) days.    [provider]  topiramate (TOPAMAX) 50 MG tablet Take 25 mg by mouth 2 (two) times daily.    [provider]  valACYclovir (VALTREX) 500 MG tablet Take 500 mg by mouth daily.    [provider]      Allergies    Latuda [lurasidone], Nitrofurantoin, and Pineapple    Review of Systems   Review of Systems Review of systems Negative for f/c.  A 10 point review of systems was performed and is negative unless otherwise reported in HPI.  Physical Exam Updated Vital Signs BP 130/84   Pulse 80   Temp 98.6 F (37 C) (Oral)   Resp (!) 23   Ht 5'  4.5" (1.638 m)   Wt 64.4 kg   LMP 05/14/2022   SpO2 100%   BMI 24.00 kg/m  Physical Exam General: Sleepy- appearing person, lying in bed.  HEENT: PERRLA, Sclera anicteric, MMM, trachea midline.  Cardiology: RRR, no murmurs/rubs/gallops. BL radial and DP pulses equal bilaterally.  Resp: Normal respiratory rate and effort. CTAB, no wheezes, rhonchi, crackles.  Abd: Soft, non-tender, non-distended. No rebound tenderness or guarding.  GU: Deferred. MSK: No peripheral edema or signs of trauma. Extremities without deformity or TTP. No  cyanosis or clubbing. Skin: warm, dry. No rashes or lesions. Back: No CVA tenderness Neuro: A&Ox4, CNs II-XII grossly intact. MAEs. Sensation grossly intact. Intact coordination. Psych: Normal mood and affect.   ED Results / Procedures / Treatments   Labs (all labs ordered are listed, but only abnormal results are displayed) Labs Reviewed  LITHIUM LEVEL - Abnormal; Notable for the following components:      Result Value   Lithium Lvl <0.06 (*)    All other components within normal limits  BASIC METABOLIC PANEL - Abnormal; Notable for the following components:   Glucose, Bld 106 (*)    Calcium 8.8 (*)    All other components within normal limits  CBC WITH DIFFERENTIAL/PLATELET - Abnormal; Notable for the following components:   MCV 100.4 (*)    All other components within normal limits  URINALYSIS, ROUTINE W REFLEX MICROSCOPIC - Abnormal; Notable for the following components:   Hgb urine dipstick SMALL (*)    Leukocytes,Ua TRACE (*)    All other components within normal limits  ETHANOL  RAPID URINE DRUG SCREEN, HOSP PERFORMED  HCG, QUANTITATIVE, PREGNANCY    EKG EKG Interpretation  Date/Time:  Friday May 14 2022 21:57:47 EST Ventricular Rate:  81 PR Interval:  195 QRS Duration: 87 QT Interval:  365 QTC Calculation: 424 R Axis:   96 Text Interpretation: Sinus rhythm LAE, consider biatrial enlargement Consider right ventricular hypertrophy Baseline wander Confirmed by Cindee Lame [phone]) on 05/14/2022 10:19:47 PM  Radiology No results found.  Procedures Procedures  {Document cardiac monitor, telemetry assessment procedure when appropriate:1}  Medications Ordered in ED Medications  lactated ringers bolus 1,000 mL (has no administration in time range)  acetaminophen (TYLENOL) tablet 1,000 mg (has no administration in time range)  ketorolac (TORADOL) 15 MG/ML injection 15 mg (has no administration in time range)    ED Course/ Medical Decision Making/ A&P                           Medical Decision Making Amount and/or Complexity of Data Reviewed Labs: ordered. Decision-making details documented in ED Course.  Risk OTC drugs. Prescription drug management.    This patient presents to the ED for concern of medication reaction, this involves an extensive number of treatment options, and is a complaint that carries with it a high risk of complications and morbidity.   MDM:    Patient w/ generalized weakness, mild lightheadedness, and concern for reaction to seroquel increased dose. Took just 75 mg, did not take more than that. Felt "drunk" last night and mildly lightheaded, no LOC. Consider seroquel toxicity but dose is low, EKG demonstrates no prolonged QTc, he is sleepy on exam but GCS 15, MAEs and follows all commands appropriately, no demonstrable confusion or AMS. Will obtain a lithium level as well d/t recent changes in dose. Will check CBC/BMP to eval for electrolyte abnormalities especially with report of developing DI, hypo-/hyperglycemia, anemia. Consider  infection such as pneumonia or UTI causing generalized weakness/fatigue, though he reports no urinary symptoms or cough.  He does report some dyspnea, will evaluate with viral panel. No leg swelling, signs/symptoms of DVT, CP, or tachycardia to suggest PE. Patient is afebrile and HDS, but given report of lightheadedness and poor PO intake over the last day, will consider dehydration and treat w/ IVF. Will give tylenol/toradol for headache.   Clinical Course as of 05/14/22 2335  Fri May 14, 2022  2137 Lithium(!): <0.06 Neg [HN]  2137 Urinalysis, Routine w reflex microscopic Urine, Clean Catch(!) Unremarkable [HN]  2137 CBC with Differential(!) wnl [HN]    Clinical Course User Index [HN] Audley Hose, MD    Labs: I Ordered, and personally interpreted labs.  The pertinent results include:  those listed above  Imaging Studies ordered: I ordered imaging studies including CXR I  independently visualized and interpreted imaging. I agree with the radiologist interpretation  Additional history obtained from ***.  External records from outside source obtained and reviewed including ***  Cardiac Monitoring: The patient was maintained on a cardiac monitor.  I personally viewed and interpreted the cardiac monitored which showed an underlying rhythm of: NSR  Reevaluation: After the interventions noted above, I reevaluated the patient and found that they have :improved  Social Determinants of Health: Patient lives independently   Disposition:  ***  Co morbidities that complicate the patient evaluation  Past Medical History:  Diagnosis Date   Anxiety    Asthma    Bipolar 1 disorder ([place])    Depression    Eczema    Excessive daytime sleepiness    Fibroid    Genital herpes    GERD (gastroesophageal reflux disease)    Vocal cord anomaly    Spastic vocal cords and sensitive trachea per ENT, persistent severe cough with irritation per pt     Medicines Meds ordered this encounter  Medications   lactated ringers bolus 1,000 mL   acetaminophen (TYLENOL) tablet 1,000 mg   ketorolac (TORADOL) 15 MG/ML injection 15 mg    I have reviewed the patients home medicines and have made adjustments as needed  Problem List / ED Course: Problem List Items Addressed This Visit   None        {Document critical care time when appropriate:1} {Document review of labs and clinical decision tools ie heart score, Chads2Vasc2 etc:1}  {Document your independent review of radiology images, and any outside records:1} {Document your discussion with family members, caretakers, and with consultants:1} {Document social determinants of health affecting pt's care:1} {Document your decision making why or why not admission, treatments were needed:1}  This note was created using dictation software, which may contain spelling or grammatical errors.

## 2022-05-14 NOTE — ED Provider Triage Note (Signed)
Emergency Medicine Provider Triage Evaluation Note  Katie [name] , a 37 y.o. adult  was evaluated in triage.  Pt complains of medication reaction.  Has mood stabilization issues and diabetes insipidus.  Patient started to decrease her lithium dose approximately 1 week ago.  She then started increasing her Seroquel dose 4 days ago.  Was initially 25 mg.  She spent 2 days at 50 mg.  And then yesterday she took 75 mg.  Within 20 minutes began to feel "drunk."  Woke up feeling the same way this morning.  States symptoms went away for approximately 1.5 hours but then returned.  States her left arm has been tingling for the last hour as well.  Review of Systems  Positive: As above Negative: As above  Physical Exam  BP (!) 143/93 (BP Location: Right Arm)   Pulse 84   Temp 98.3 F (36.8 C) (Oral)   Resp 16   Ht 5' 4.5" (1.638 m)   Wt 64.4 kg   LMP 05/14/2022   SpO2 100%   BMI 24.00 kg/m  Gen:   Awake, no distress   Resp:  Normal effort  MSK:   Moves extremities without difficulty  Other:  Anxious  Medical Decision Making  Medically screening exam initiated at 5:33 PM.  Appropriate orders placed.  Katie [name] was informed that the remainder of the evaluation will be completed by another provider, this initial triage assessment does not replace that evaluation, and the importance of remaining in the ED until their evaluation is complete.     Katie [name], [place] 05/14/22 1735

## 2022-05-14 NOTE — Discharge Instructions (Addendum)
Thank you for coming to [facility] Emergency Department. You were seen for concern for a medication reaction. We did an exam, labs, and imaging, and these showed no acute findings. Your labs were okay and your EKG was normal. You likely experienced an adverse effect from the increased dose of seroquel. Please decrease back to the dose you were taking before, to 50 mg. Please stay well hydrated at home. Please follow up with your primary care provider within 1 week.   Do not hesitate to return to the ED or call 911 if you experience: -Worsening symptoms -Chest pain, shortness of breath -Lightheadedness, passing out -Fevers/chills -Anything else that concerns you

## 2022-05-14 NOTE — ED Triage Notes (Signed)
Patient reports that her dosages of Seroquel( increased) and Lithium(decreased). Patient states that she woke this AM "feeling drunk", feeling hot, nauseated, and feeling dizzy.  Patient states that while en route to the ED she began having tingling of her left arm.

## 2022-05-14 NOTE — ED Notes (Signed)
Patient stated they do not want an IV, fluids, or another blood draw; states that she is feeling better and would like to be discharged. This RN notified ED provider, Dr. Mayra Neer.

## 2022-05-15 ENCOUNTER — Ambulatory Visit (HOSPITAL_COMMUNITY): Admission: RE | Admit: 2022-05-15 | Payer: BC Managed Care – PPO | Source: Ambulatory Visit

## 2022-05-20 DIAGNOSIS — F3171 Bipolar disorder, in partial remission, most recent episode hypomanic: Secondary | ICD-10-CM | POA: Diagnosis not present

## 2022-05-21 ENCOUNTER — Ambulatory Visit: Payer: BC Managed Care – PPO

## 2022-05-21 DIAGNOSIS — R0602 Shortness of breath: Secondary | ICD-10-CM | POA: Diagnosis not present

## 2022-05-21 DIAGNOSIS — R072 Precordial pain: Secondary | ICD-10-CM

## 2022-05-25 DIAGNOSIS — F319 Bipolar disorder, unspecified: Secondary | ICD-10-CM | POA: Diagnosis not present

## 2022-05-25 DIAGNOSIS — F900 Attention-deficit hyperactivity disorder, predominantly inattentive type: Secondary | ICD-10-CM | POA: Diagnosis not present

## 2022-05-25 DIAGNOSIS — F649 Gender identity disorder, unspecified: Secondary | ICD-10-CM | POA: Diagnosis not present

## 2022-05-25 DIAGNOSIS — F411 Generalized anxiety disorder: Secondary | ICD-10-CM | POA: Diagnosis not present

## 2022-05-26 DIAGNOSIS — F319 Bipolar disorder, unspecified: Secondary | ICD-10-CM | POA: Diagnosis not present

## 2022-05-26 DIAGNOSIS — F640 Transsexualism: Secondary | ICD-10-CM | POA: Diagnosis not present

## 2022-05-26 DIAGNOSIS — I10 Essential (primary) hypertension: Secondary | ICD-10-CM | POA: Diagnosis not present

## 2022-05-26 DIAGNOSIS — R7989 Other specified abnormal findings of blood chemistry: Secondary | ICD-10-CM | POA: Diagnosis not present

## 2022-05-28 DIAGNOSIS — F3171 Bipolar disorder, in partial remission, most recent episode hypomanic: Secondary | ICD-10-CM | POA: Diagnosis not present

## 2022-06-02 DIAGNOSIS — F3171 Bipolar disorder, in partial remission, most recent episode hypomanic: Secondary | ICD-10-CM | POA: Diagnosis not present

## 2022-06-03 ENCOUNTER — Ambulatory Visit: Payer: BC Managed Care – PPO | Admitting: Internal Medicine

## 2022-06-03 VITALS — BP 135/83 | HR 74 | Ht 64.5 in | Wt 144.0 lb

## 2022-06-03 DIAGNOSIS — R002 Palpitations: Secondary | ICD-10-CM | POA: Diagnosis not present

## 2022-06-03 DIAGNOSIS — F640 Transsexualism: Secondary | ICD-10-CM | POA: Diagnosis not present

## 2022-06-03 DIAGNOSIS — Z79899 Other long term (current) drug therapy: Secondary | ICD-10-CM | POA: Diagnosis not present
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
## 2022-06-04 DIAGNOSIS — F3171 Bipolar disorder, in partial remission, most recent episode hypomanic: Secondary | ICD-10-CM | POA: Diagnosis not present

## 2022-06-07 DIAGNOSIS — J069 Acute upper respiratory infection, unspecified: Secondary | ICD-10-CM | POA: Diagnosis not present

## 2022-06-09 ENCOUNTER — Ambulatory Visit: Payer: BC Managed Care – PPO

## 2022-06-10 ENCOUNTER — Other Ambulatory Visit: Payer: Self-pay | Admitting: Family Medicine

## 2022-06-10 ENCOUNTER — Ambulatory Visit
Admission: RE | Admit: 2022-06-10 | Discharge: 2022-06-10 | Disposition: A | Discharge status: Home / Self Care | Payer: BC Managed Care – PPO | Source: Ambulatory Visit | Attending: Family Medicine | Admitting: Family Medicine

## 2022-06-10 DIAGNOSIS — N644 Mastodynia: Secondary | ICD-10-CM

## 2022-06-10 DIAGNOSIS — F649 Gender identity disorder, unspecified: Secondary | ICD-10-CM | POA: Diagnosis not present

## 2022-06-10 DIAGNOSIS — F640 Transsexualism: Secondary | ICD-10-CM | POA: Diagnosis not present

## 2022-06-10 DIAGNOSIS — Z79899 Other long term (current) drug therapy: Secondary | ICD-10-CM | POA: Diagnosis not present

## 2022-06-10 DIAGNOSIS — Z803 Family history of malignant neoplasm of breast: Secondary | ICD-10-CM
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
## 2022-06-11 DIAGNOSIS — F3171 Bipolar disorder, in partial remission, most recent episode hypomanic: Secondary | ICD-10-CM | POA: Diagnosis not present

## 2022-06-14 DIAGNOSIS — J069 Acute upper respiratory infection, unspecified: Secondary | ICD-10-CM | POA: Diagnosis not present

## 2022-06-14 DIAGNOSIS — B9689 Other specified bacterial agents as the cause of diseases classified elsewhere: Secondary | ICD-10-CM | POA: Diagnosis not present

## 2022-06-14 DIAGNOSIS — B370 Candidal stomatitis: Secondary | ICD-10-CM | POA: Diagnosis not present

## 2022-06-14 DIAGNOSIS — J329 Chronic sinusitis, unspecified: Secondary | ICD-10-CM | POA: Diagnosis not present

## 2022-06-15 DIAGNOSIS — F3171 Bipolar disorder, in partial remission, most recent episode hypomanic: Secondary | ICD-10-CM | POA: Diagnosis not present

## 2022-06-22 DIAGNOSIS — F319 Bipolar disorder, unspecified: Secondary | ICD-10-CM | POA: Diagnosis not present

## 2022-06-22 DIAGNOSIS — F411 Generalized anxiety disorder: Secondary | ICD-10-CM | POA: Diagnosis not present

## 2022-06-22 DIAGNOSIS — F640 Transsexualism: Secondary | ICD-10-CM | POA: Diagnosis not present

## 2022-06-22 DIAGNOSIS — F900 Attention-deficit hyperactivity disorder, predominantly inattentive type: Secondary | ICD-10-CM | POA: Diagnosis not present

## 2022-06-24 ENCOUNTER — Ambulatory Visit
Admission: RE | Admit: 2022-06-24 | Discharge: 2022-06-24 | Disposition: A | Discharge status: Home / Self Care | Payer: BC Managed Care – PPO | Source: Ambulatory Visit | Attending: Family Medicine | Admitting: Family Medicine

## 2022-06-24 DIAGNOSIS — N6002 Solitary cyst of left breast: Secondary | ICD-10-CM | POA: Diagnosis not present

## 2022-06-24 DIAGNOSIS — N644 Mastodynia: Secondary | ICD-10-CM

## 2022-06-24 DIAGNOSIS — R928 Other abnormal and inconclusive findings on diagnostic imaging of breast: Secondary | ICD-10-CM | POA: Diagnosis not present

## 2022-06-25 DIAGNOSIS — F3171 Bipolar disorder, in partial remission, most recent episode hypomanic: Secondary | ICD-10-CM | POA: Diagnosis not present

## 2022-06-30 DIAGNOSIS — I10 Essential (primary) hypertension: Secondary | ICD-10-CM | POA: Diagnosis not present

## 2022-06-30 DIAGNOSIS — J45901 Unspecified asthma with (acute) exacerbation: Secondary | ICD-10-CM | POA: Diagnosis not present

## 2022-06-30 DIAGNOSIS — Z77120 Contact with and (suspected) exposure to mold (toxic): Secondary | ICD-10-CM | POA: Diagnosis not present

## 2022-06-30 DIAGNOSIS — J45909 Unspecified asthma, uncomplicated: Secondary | ICD-10-CM | POA: Diagnosis not present

## 2022-07-01 DIAGNOSIS — Z77120 Contact with and (suspected) exposure to mold (toxic): Secondary | ICD-10-CM | POA: Diagnosis not present

## 2022-07-01 DIAGNOSIS — J45909 Unspecified asthma, uncomplicated: Secondary | ICD-10-CM | POA: Diagnosis not present

## 2022-07-02 DIAGNOSIS — F3171 Bipolar disorder, in partial remission, most recent episode hypomanic: Secondary | ICD-10-CM | POA: Diagnosis not present

## 2022-07-08 ENCOUNTER — Inpatient Hospital Stay
Admit: 2022-07-08 | Discharge: 2022-07-08 | Disposition: A | Discharge status: Home / Self Care | Payer: BLUE CROSS/BLUE SHIELD | Attending: Student in an Organized Health Care Education/Training Program

## 2022-07-08 ENCOUNTER — Emergency Department: Admit: 2022-07-08 | Payer: BLUE CROSS/BLUE SHIELD

## 2022-07-08 DIAGNOSIS — W19XXXA Unspecified fall, initial encounter: Secondary | ICD-10-CM | POA: Diagnosis not present

## 2022-07-08 DIAGNOSIS — M25512 Pain in left shoulder: Secondary | ICD-10-CM | POA: Diagnosis not present

## 2022-07-08 DIAGNOSIS — M25522 Pain in left elbow: Secondary | ICD-10-CM | POA: Diagnosis not present

## 2022-07-08 DIAGNOSIS — Z23 Encounter for immunization: Secondary | ICD-10-CM | POA: Diagnosis not present

## 2022-07-08 DIAGNOSIS — Z043 Encounter for examination and observation following other accident: Secondary | ICD-10-CM | POA: Diagnosis not present

## 2022-07-08 DIAGNOSIS — M79602 Pain in left arm: Secondary | ICD-10-CM | POA: Diagnosis not present

## 2022-07-08 DIAGNOSIS — S50312A Abrasion of left elbow, initial encounter: Secondary | ICD-10-CM | POA: Diagnosis not present

## 2022-07-08 DIAGNOSIS — M25532 Pain in left wrist: Secondary | ICD-10-CM | POA: Diagnosis not present

## 2022-07-08 DIAGNOSIS — S50812A Abrasion of left forearm, initial encounter: Secondary | ICD-10-CM | POA: Diagnosis not present

## 2022-07-08 DIAGNOSIS — W1830XA Fall on same level, unspecified, initial encounter: Secondary | ICD-10-CM | POA: Diagnosis not present

## 2022-07-08 MED ORDER — TETANUS-DIPHTHERIA TOXOIDS TD 2-2 LF/0.5ML IM SUSP
2-2 | Freq: Once | INTRAMUSCULAR | Status: AC
Start: 2022-07-08 — End: 2022-07-08
  Administered 2022-07-08: 18:00:00 0.5 mL via INTRAMUSCULAR

## 2022-07-08 MED FILL — TDVAX 2-2 LF/0.5ML IM SUSP: 2-2 LF/0.5ML | INTRAMUSCULAR | Qty: 0.5

## 2022-07-08 NOTE — ED Triage Notes (Signed)
Pt reports she GLF prior to arrival landing on her LEFT side. Pt c/o LEFT elbow, wrist, and arm pain. Pt states she also scraped her LEFT hip. Denies hitting head or LOC. Last Tetanus 2018.

## 2022-07-08 NOTE — ED Provider Notes (Signed)
 [facility] EMERGENCY DEP  EMERGENCY DEPARTMENT ENCOUNTER      Pt Name: Sheryl Valencia  MRN: [id number]  Birthdate Sep 09, 1985  Date of evaluation: 07/08/2022  Provider: Sueanne JENEANE Camp, APRN - NP    CHIEF COMPLAINT       Chief Complaint   Patient presents with    Elbow Injury         HISTORY OF PRESENT ILLNESS   (Location/Symptom, Timing/Onset, Context/Setting, Quality, Duration, Modifying Factors, Severity)  Note limiting factors.   Sheryl Valencia is a 37 y.o. female presenting to the ED c/o falling on left arm pain and abrasion to left side around 10:45 AM. Last tetanus was Jan 2018.     Denies hitting head, LOC, taking med for pain, pregnancy.     Medical hx: in process of work-up for diabetes incipidus, HTN, testosterone use.   Surgical hx: bilateral tubal ligation, uterine ablation  Social hx: denies smoking, etoh, drug, marijuana.      The history is provided by the patient. No language interpreter was used.         Review of External Medical Records:     Nursing Notes were reviewed.    REVIEW OF SYSTEMS    (2-9 systems for level 4, 10 or more for level 5)     Review of Systems   Constitutional:  Negative for activity change and appetite change.   Eyes:  Negative for visual disturbance.   Musculoskeletal:  Negative for back pain, gait problem and myalgias.        Left arm pain   Skin:  Positive for wound. Negative for rash.        Abrasion to left elbow   Neurological:  Negative for syncope.   All other systems reviewed and are negative.      Except as noted above the remainder of the review of systems was reviewed and negative.       PAST MEDICAL HISTORY     Past Medical History:   Diagnosis Date    Hypertension          SURGICAL HISTORY     History reviewed. No pertinent surgical history.      CURRENT MEDICATIONS       There are no discharge medications for this patient.      ALLERGIES     Latuda [lurasidone], Nitrofuran derivatives, and Pineapple    FAMILY HISTORY     History reviewed. No  pertinent family history.       SOCIAL HISTORY       Social History     Socioeconomic History    Marital status: Married     Spouse name: None    Number of children: None    Years of education: None    Highest education level: None           PHYSICAL EXAM    (up to 7 for level 4, 8 or more for level 5)     ED Triage Vitals   BP Temp Temp src Pulse Resp SpO2 Height Weight   -- -- -- -- -- -- -- --       There is no height or weight on file to calculate BMI.    Physical Exam  Vitals reviewed.   Constitutional:       General: She is not in acute distress.     Appearance: Normal appearance. She is not ill-appearing.   HENT:      Head: Normocephalic and atraumatic.  Cardiovascular:      Rate and Rhythm: Normal rate and regular rhythm.   Pulmonary:      Effort: Pulmonary effort is normal. No respiratory distress.      Breath sounds: Normal breath sounds. No wheezing, rhonchi or rales.   Abdominal:      Tenderness: There is no left CVA tenderness.   Musculoskeletal:         General: Normal range of motion.      Left shoulder: Bony tenderness present. No swelling or deformity.      Left upper arm: Tenderness present. No swelling or deformity.      Left elbow: No swelling or deformity. Tenderness present.      Left forearm: Tenderness present. No swelling or deformity.      Left wrist: Tenderness present. No swelling, deformity or snuff box tenderness. Normal pulse.      Left hand: Normal. No tenderness. Normal range of motion. Normal capillary refill. Normal pulse.      Cervical back: Normal range of motion. No tenderness or bony tenderness.      Thoracic back: No bony tenderness.      Lumbar back: No bony tenderness.      Left hip: Normal. No deformity or tenderness.   Skin:     General: Skin is warm.      Capillary Refill: Capillary refill takes less than 2 seconds.          Neurological:      General: No focal deficit present.      Mental Status: She is alert and oriented to person, place, and time. Mental status is at  baseline.      Sensory: No sensory deficit.      Gait: Gait normal.   Psychiatric:         Mood and Affect: Mood normal.         DIAGNOSTIC RESULTS     EKG: All EKG's are interpreted by the Emergency Department Physician who either signs or Co-signs this chart in the absence of a cardiologist.    RADIOLOGY:   Non-plain film images such as CT, Ultrasound and MRI are read by the radiologist. Plain radiographic images are visualized and preliminarily interpreted by the emergency physician with the below findings:    Interpretation per the Radiologist below, if available at the time of this note:    XR SHOULDER LEFT (MIN 2 VIEWS)   Final Result   No acute abnormality.      XR ELBOW LEFT (MIN 3 VIEWS)   Final Result   No acute abnormality.      XR WRIST LEFT (MIN 3 VIEWS)   Final Result   No acute abnormality.           LABS:  Labs Reviewed - No data to display    All other labs were within normal range or not returned as of this dictation.    EMERGENCY DEPARTMENT COURSE and DIFFERENTIAL DIAGNOSIS/MDM:   Vitals:    Vitals:    07/08/22 1155   BP: (!) 148/83   Pulse: 82   Resp: 20   Temp: 98 F (36.7 C)   TempSrc: Temporal   SpO2: 99%   Weight: 63.5 kg (139 lb 15.9 oz)       12:00 PM  Pt offered pain medication but declines at this time.     Medical Decision Making  Amount and/or Complexity of Data Reviewed  Radiology: ordered.    Risk  Prescription drug management.  Patient is a 37 year old female presenting to the emergency room complaining of left arm pain with abrasion to the left elbow after having a fall around 10:45 AM today.  Patient's tetanus vaccine was updated while in the ED.  Doubt acute fracture given x-rays.  Doubt neurovascular injury given strong radial pulses were noted, cap refills less than 3, and patient denies decreased sensation of the upper extremities.  Patient was offered pain medication but patient declines at this time.  Patient is encouraged to continue ibuprofen and Tylenol as needed to  help with pain and to follow-up closely with orthopedic provider for reevaluation. Strict return to ED precautions given. ACI discussed with the patient; see instruction below. Patient verbalized understanding.    CONSULTS:  None    PROCEDURES:  Unless otherwise noted below, none     Procedures      FINAL IMPRESSION      1. Fall, initial encounter          DISPOSITION/PLAN   DISPOSITION Decision To Discharge 07/08/2022 01:18:25 PM      PATIENT REFERRED TO:  [facility]  [address]  [address]  [place]  [phone]  Schedule an appointment as soon as possible for a visit in 5 days        DISCHARGE MEDICATIONS:  There are no discharge medications for this patient.        (Please note that portions of this note were completed with a voice recognition program.  Efforts were made to edit the dictations but occasionally words are mis-transcribed.)    Marios Gaiser Z Kyshawn Teal, APRN - NP (electronically signed)  Emergency Attending Physician / Physician Assistant / Nurse Practitioner             Nivia Sueanne PEDLAR, APRN - NP  07/08/22 [phone]

## 2022-07-08 NOTE — ED Notes (Signed)
Discharged by Patrici Ranks NP

## 2022-07-08 NOTE — Discharge Instructions (Addendum)
Take ibuprofen and tylenol as needed for pain. Make sure to take ibuprofen with food.     Follow-up closely with an orthopedic provider for reevaluation. Call for appointment as soon as possible.

## 2022-07-09 DIAGNOSIS — Z77120 Contact with and (suspected) exposure to mold (toxic): Secondary | ICD-10-CM | POA: Diagnosis not present

## 2022-07-09 DIAGNOSIS — B370 Candidal stomatitis: Secondary | ICD-10-CM | POA: Diagnosis not present

## 2022-07-09 DIAGNOSIS — F3171 Bipolar disorder, in partial remission, most recent episode hypomanic: Secondary | ICD-10-CM | POA: Diagnosis not present

## 2022-07-13 DIAGNOSIS — F649 Gender identity disorder, unspecified: Secondary | ICD-10-CM | POA: Diagnosis not present

## 2022-07-13 DIAGNOSIS — Z79899 Other long term (current) drug therapy: Secondary | ICD-10-CM | POA: Diagnosis not present

## 2022-07-13 DIAGNOSIS — F640 Transsexualism: Secondary | ICD-10-CM | POA: Diagnosis not present

## 2022-07-16 DIAGNOSIS — F3171 Bipolar disorder, in partial remission, most recent episode hypomanic: Secondary | ICD-10-CM | POA: Diagnosis not present

## 2022-07-19 DIAGNOSIS — F3171 Bipolar disorder, in partial remission, most recent episode hypomanic: Secondary | ICD-10-CM | POA: Diagnosis not present

## 2022-07-20 DIAGNOSIS — F319 Bipolar disorder, unspecified: Secondary | ICD-10-CM | POA: Diagnosis not present

## 2022-07-20 DIAGNOSIS — F649 Gender identity disorder, unspecified: Secondary | ICD-10-CM | POA: Diagnosis not present

## 2022-07-20 DIAGNOSIS — F900 Attention-deficit hyperactivity disorder, predominantly inattentive type: Secondary | ICD-10-CM | POA: Diagnosis not present

## 2022-07-20 DIAGNOSIS — F411 Generalized anxiety disorder: Secondary | ICD-10-CM | POA: Diagnosis not present

## 2022-07-23 DIAGNOSIS — F640 Transsexualism: Secondary | ICD-10-CM | POA: Diagnosis not present

## 2022-07-23 DIAGNOSIS — Z79899 Other long term (current) drug therapy: Secondary | ICD-10-CM | POA: Diagnosis not present

## 2022-07-23 DIAGNOSIS — T7431XA Adult psychological abuse, confirmed, initial encounter: Secondary | ICD-10-CM | POA: Diagnosis not present

## 2022-07-23 DIAGNOSIS — Z599 Problem related to housing and economic circumstances, unspecified: Secondary | ICD-10-CM | POA: Diagnosis not present

## 2022-07-23 DIAGNOSIS — Z5901 Sheltered homelessness: Secondary | ICD-10-CM | POA: Diagnosis not present

## 2022-07-30 DIAGNOSIS — F3171 Bipolar disorder, in partial remission, most recent episode hypomanic: Secondary | ICD-10-CM | POA: Diagnosis not present

## 2022-08-02 IMAGING — DX DG CHEST 2V
2 series · 2 of 2 positions shown · non-contrast
Comparison: None.

CLINICAL DATA: Chronic cough.

EXAM:
CHEST - 2 VIEW

[dg chest 2 view (1 of 2)]
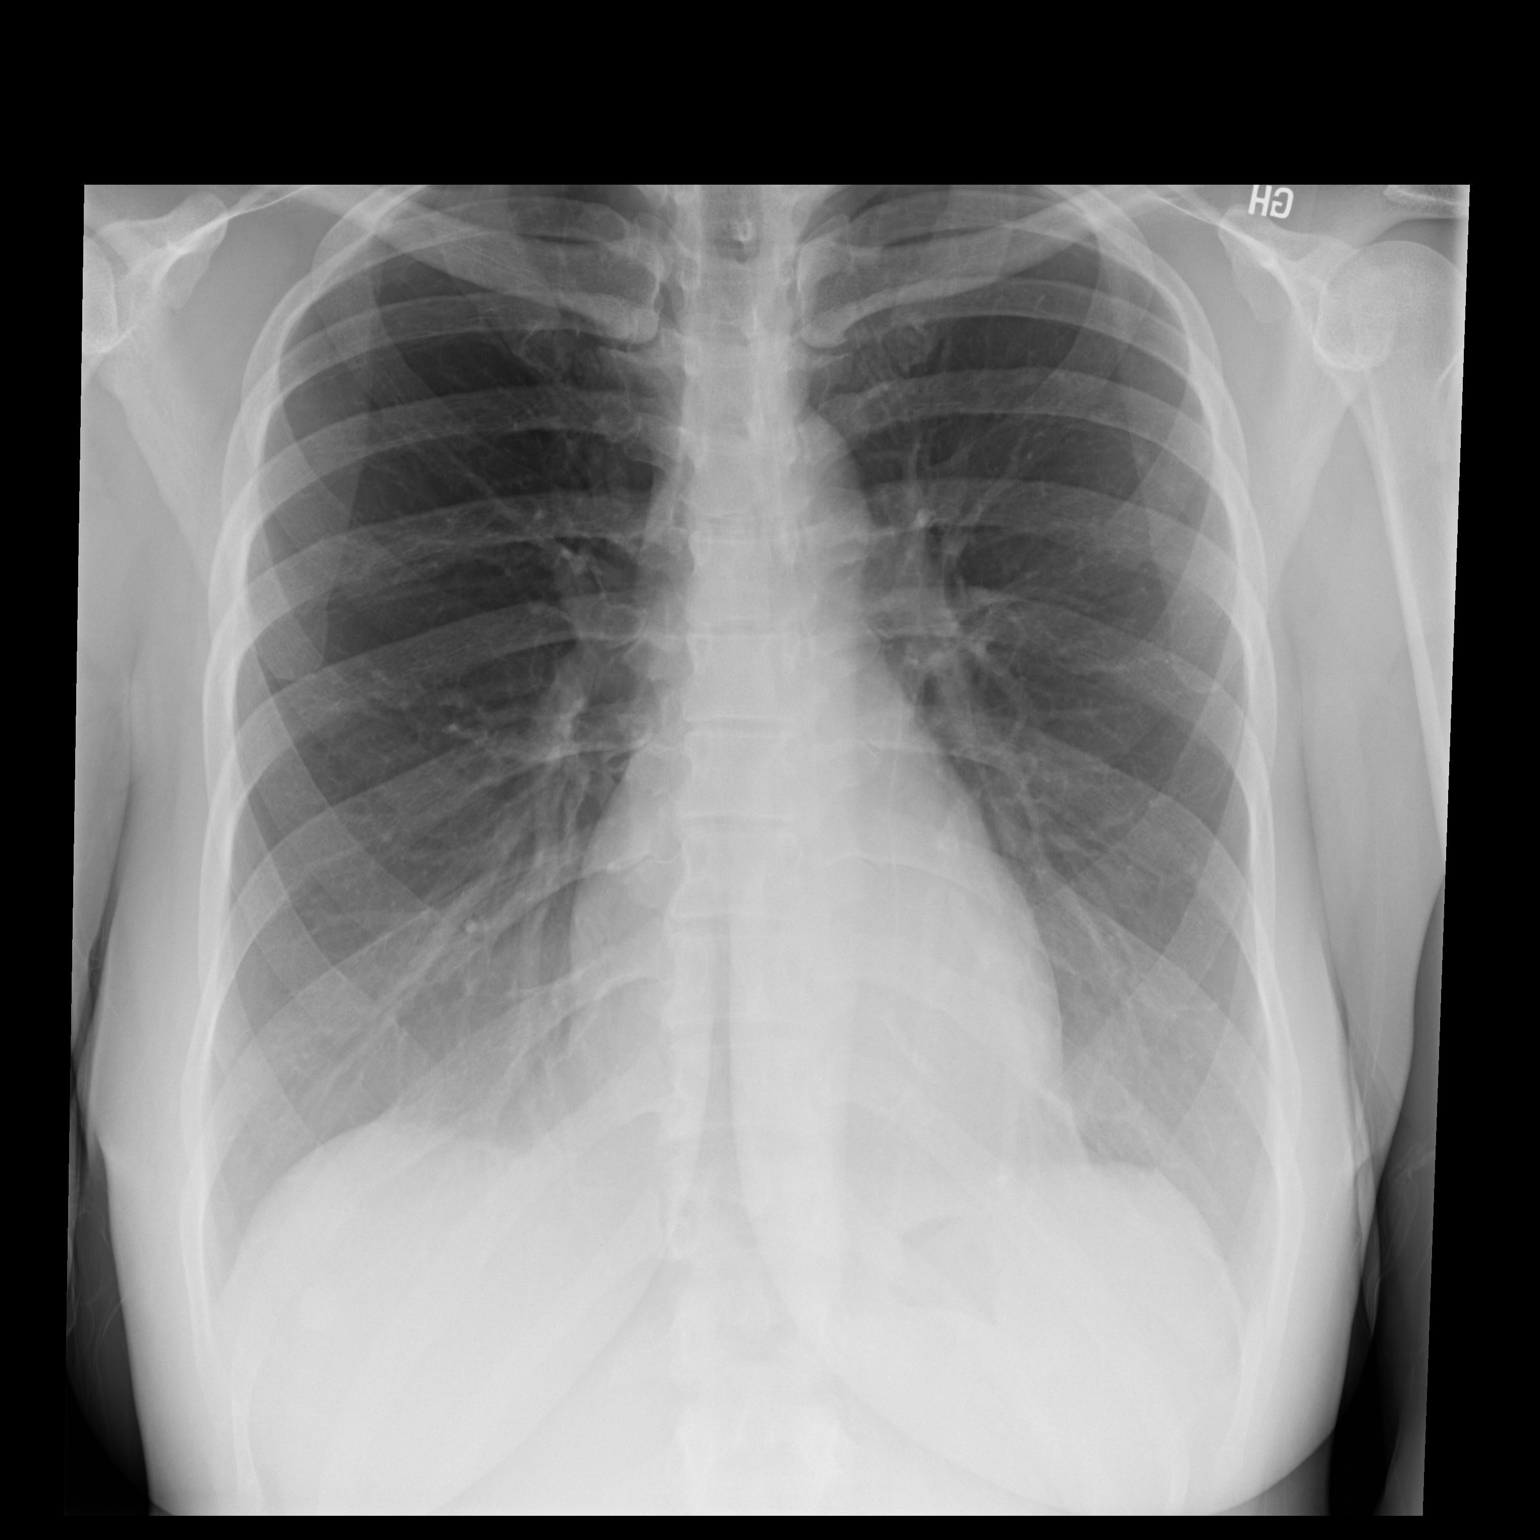

[dg chest 2 view (2 of 2)]
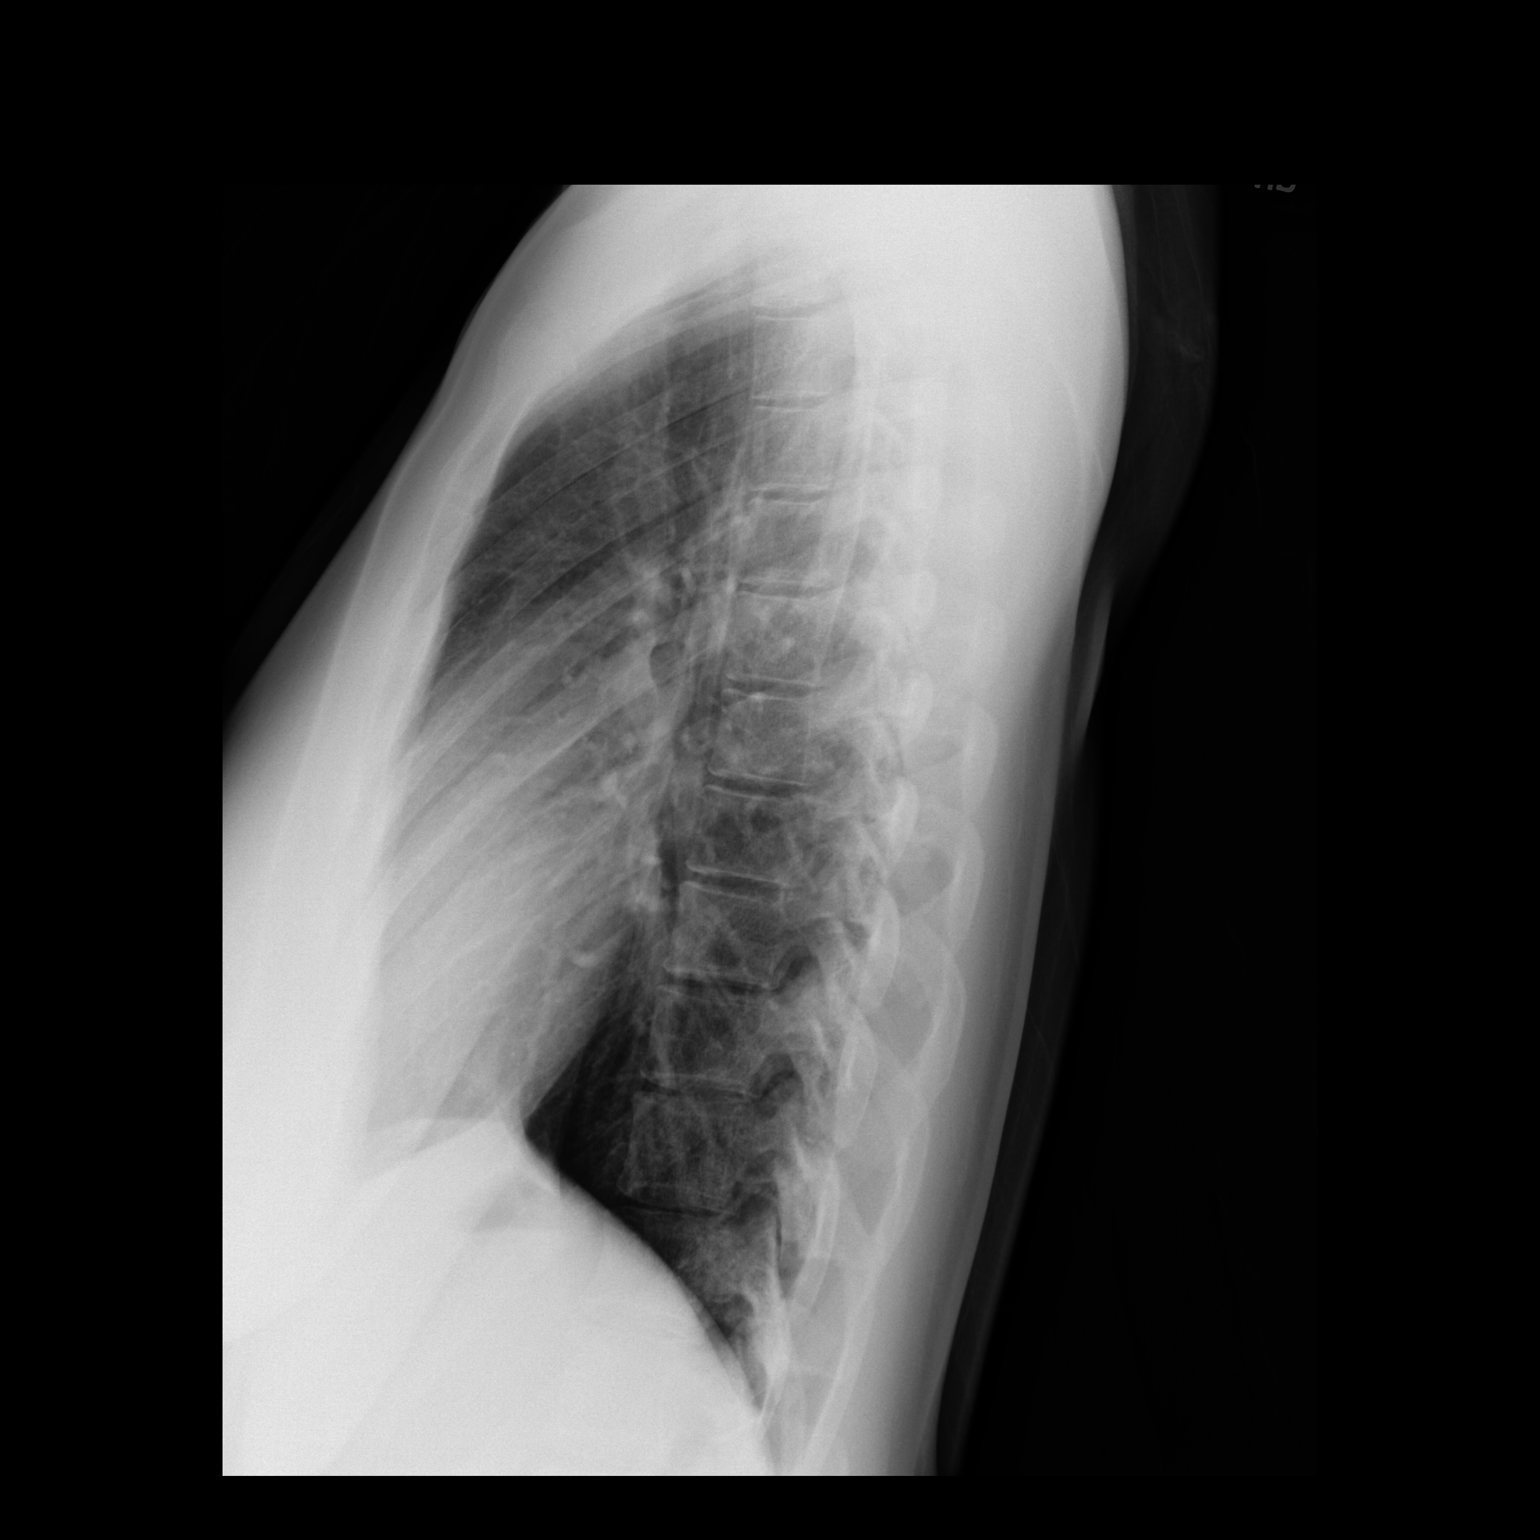

[2 of 2 positions shown; findings below may reference images not displayed]

FINDINGS: The heart size and mediastinal contours are within normal limits.
Both lungs are clear. The visualized skeletal structures are
unremarkable.
IMPRESSION: No active cardiopulmonary disease.

## 2022-08-06 DIAGNOSIS — F3171 Bipolar disorder, in partial remission, most recent episode hypomanic: Secondary | ICD-10-CM | POA: Diagnosis not present

## 2022-08-09 DIAGNOSIS — B370 Candidal stomatitis: Secondary | ICD-10-CM | POA: Diagnosis not present

## 2022-08-09 DIAGNOSIS — J309 Allergic rhinitis, unspecified: Secondary | ICD-10-CM | POA: Diagnosis not present

## 2022-08-13 DIAGNOSIS — F3171 Bipolar disorder, in partial remission, most recent episode hypomanic: Secondary | ICD-10-CM | POA: Diagnosis not present

## 2022-08-20 DIAGNOSIS — F3171 Bipolar disorder, in partial remission, most recent episode hypomanic: Secondary | ICD-10-CM | POA: Diagnosis not present

## 2022-08-23 DIAGNOSIS — F331 Major depressive disorder, recurrent, moderate: Secondary | ICD-10-CM | POA: Diagnosis not present

## 2022-08-23 DIAGNOSIS — F411 Generalized anxiety disorder: Secondary | ICD-10-CM | POA: Diagnosis not present

## 2022-08-23 DIAGNOSIS — T7840XA Allergy, unspecified, initial encounter: Secondary | ICD-10-CM | POA: Diagnosis not present

## 2022-08-23 DIAGNOSIS — G4700 Insomnia, unspecified: Secondary | ICD-10-CM | POA: Diagnosis not present

## 2022-08-26 DIAGNOSIS — R631 Polydipsia: Secondary | ICD-10-CM | POA: Diagnosis not present

## 2022-08-26 DIAGNOSIS — Z87898 Personal history of other specified conditions: Secondary | ICD-10-CM | POA: Diagnosis not present

## 2022-08-26 DIAGNOSIS — Z79899 Other long term (current) drug therapy: Secondary | ICD-10-CM | POA: Diagnosis not present

## 2022-08-26 DIAGNOSIS — Z789 Other specified health status: Secondary | ICD-10-CM | POA: Diagnosis not present

## 2022-08-27 DIAGNOSIS — F3171 Bipolar disorder, in partial remission, most recent episode hypomanic: Secondary | ICD-10-CM | POA: Diagnosis not present

## 2022-09-03 DIAGNOSIS — F3171 Bipolar disorder, in partial remission, most recent episode hypomanic: Secondary | ICD-10-CM | POA: Diagnosis not present

## 2022-09-07 DIAGNOSIS — Z79899 Other long term (current) drug therapy: Secondary | ICD-10-CM | POA: Diagnosis not present

## 2022-09-07 DIAGNOSIS — Z87898 Personal history of other specified conditions: Secondary | ICD-10-CM | POA: Diagnosis not present

## 2022-09-07 DIAGNOSIS — R631 Polydipsia: Secondary | ICD-10-CM | POA: Diagnosis not present

## 2022-09-07 DIAGNOSIS — E878 Other disorders of electrolyte and fluid balance, not elsewhere classified: Secondary | ICD-10-CM | POA: Diagnosis not present

## 2022-09-10 DIAGNOSIS — F3171 Bipolar disorder, in partial remission, most recent episode hypomanic: Secondary | ICD-10-CM | POA: Diagnosis not present

## 2022-09-12 DIAGNOSIS — R519 Headache, unspecified: Secondary | ICD-10-CM | POA: Diagnosis not present

## 2022-09-13 DIAGNOSIS — F430 Acute stress reaction: Secondary | ICD-10-CM | POA: Diagnosis not present

## 2022-09-13 DIAGNOSIS — Z5900 Homelessness unspecified: Secondary | ICD-10-CM | POA: Diagnosis not present

## 2022-09-13 DIAGNOSIS — R569 Unspecified convulsions: Secondary | ICD-10-CM | POA: Diagnosis not present

## 2022-09-14 ENCOUNTER — Encounter (HOSPITAL_BASED_OUTPATIENT_CLINIC_OR_DEPARTMENT_OTHER): Payer: Self-pay

## 2022-09-14 ENCOUNTER — Emergency Department (HOSPITAL_BASED_OUTPATIENT_CLINIC_OR_DEPARTMENT_OTHER)
Admission: EM | Admit: 2022-09-14 | Discharge: 2022-09-14 | Disposition: A | Discharge status: Home / Self Care | Payer: BC Managed Care – PPO | Attending: Emergency Medicine | Admitting: Emergency Medicine

## 2022-09-14 ENCOUNTER — Emergency Department (HOSPITAL_BASED_OUTPATIENT_CLINIC_OR_DEPARTMENT_OTHER): Payer: BC Managed Care – PPO

## 2022-09-14 ENCOUNTER — Other Ambulatory Visit: Payer: Self-pay

## 2022-09-14 DIAGNOSIS — R569 Unspecified convulsions: Secondary | ICD-10-CM

## 2022-09-14 DIAGNOSIS — R42 Dizziness and giddiness: Secondary | ICD-10-CM | POA: Diagnosis not present

## 2022-09-14 LAB — CBC WITH DIFFERENTIAL/PLATELET
Abs Immature Granulocytes: 0.03 10*3/uL (ref 0.00–0.07)
Basophils Absolute: 0 10*3/uL (ref 0.0–0.1)
Basophils Relative: 1 %
Eosinophils Absolute: 0.1 10*3/uL (ref 0.0–0.5)
Eosinophils Relative: 1 %
HCT: 44.2 % (ref 36.0–46.0)
Hemoglobin: 13.9 g/dL (ref 12.0–15.0)
Immature Granulocytes: 0 %
Lymphocytes Relative: 34 %
Lymphs Abs: 2.9 10*3/uL (ref 0.7–4.0)
MCH: 29.7 pg (ref 26.0–34.0)
MCHC: 31.4 g/dL (ref 30.0–36.0)
MCV: 94.4 fL (ref 80.0–100.0)
Monocytes Absolute: 0.4 10*3/uL (ref 0.1–1.0)
Monocytes Relative: 5 %
Neutro Abs: 5.2 10*3/uL (ref 1.7–7.7)
Neutrophils Relative %: 59 %
Platelets: 362 10*3/uL (ref 150–400)
RBC: 4.68 MIL/uL (ref 3.87–5.11)
RDW: 13.7 % (ref 11.5–15.5)
WBC: 8.6 10*3/uL (ref 4.0–10.5)
nRBC: 0 % (ref 0.0–0.2)

## 2022-09-14 LAB — COMPREHENSIVE METABOLIC PANEL
ALT: 15 U/L (ref 0–44)
AST: 18 U/L (ref 15–41)
Albumin: 3.6 g/dL (ref 3.5–5.0)
Alkaline Phosphatase: 68 U/L (ref 38–126)
Anion gap: 9 (ref 5–15)
BUN: 11 mg/dL (ref 6–20)
CO2: 24 mmol/L (ref 22–32)
Calcium: 8.7 mg/dL — ABNORMAL LOW (ref 8.9–10.3)
Chloride: 106 mmol/L (ref 98–111)
Creatinine, Ser: 0.87 mg/dL (ref 0.44–1.00)
GFR, Estimated: >60 mL/min (ref >60)
Glucose, Bld: 96 mg/dL (ref 70–99)
Potassium: 3.5 mmol/L (ref 3.5–5.1)
Sodium: 139 mmol/L (ref 135–145)
Total Bilirubin: 0.2 mg/dL — ABNORMAL LOW (ref 0.3–1.2)
Total Protein: 6.9 g/dL (ref 6.5–8.1)

## 2022-09-14 LAB — PREGNANCY, URINE: Preg Test, Ur: NEGATIVE

## 2022-09-14 MED ORDER — SODIUM CHLORIDE 0.9 % IV BOLUS
1000.0000 mL | Freq: Once | INTRAVENOUS | Status: AC
  Administered 2022-09-14: 1000 mL via INTRAVENOUS

## 2022-09-14 NOTE — ED Notes (Signed)
Pt reports unwitnessed seizure x4 days ago and numbness and tingling in L face and L side of body last night.  Additionally, Pt reports "weird sensation" in central chest and several episodes of apnea last night.  Denies unilateral weakness.    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

## 2022-09-14 NOTE — ED Provider Notes (Signed)
[name] Provider Note   CSN: [id number] Arrival date & time: 09/14/22  8469     History  Chief Complaint  Patient presents with   Seizures    Katie [name] is a 37 y.o. adult who presents emergency department with concerns for unwitnessed seizure x 4 days ago.  Patient reports that they have been told that there seizures are psychological nonepileptic seizures.  Is not followed by neurologist.  Has an appointment with their psychiatrist tomorrow.  Has therapy sessions with her psychology team every Friday.  Patient has dizziness.  Notes patient intermittently feels an electrical impulse throughout the body and typically feels that prior to the onset of seizure-like activity.  No electrical impulse at this time the symptoms have resolved at this time.  Denies chest pain or shortness of breath.  The history is provided by the patient. No language interpreter was used.       Home Medications Prior to Admission medications   Medication Sig Start Date End Date Taking? Authorizing Provider  acyclovir (ZOVIRAX) 400 MG tablet Take 400 mg by mouth daily. 04/22/21   [provider]  albuterol (VENTOLIN HFA) 108 (90 Base) MCG/ACT inhaler Inhale 2 puffs into the lungs every 4 (four) hours as needed. 04/01/21   [provider]  amphetamine-dextroamphetamine (ADDERALL XR) 10 MG 24 hr capsule Take 10 mg by mouth daily. 06/18/21   [provider]  amphetamine-dextroamphetamine (ADDERALL) 5 MG tablet Take 5 mg by mouth as needed.    [provider]  Cholecalciferol (VITAMIN D3) 1.25 MG (50000 UT) CAPS Take by mouth.    [provider]  cloNIDine HCl (KAPVAY) 0.1 MG TB12 ER tablet Take 0.1 mg by mouth at bedtime. 05/31/22   [provider]  diazepam (VALIUM) 2 MG tablet Take 1 mg by mouth at bedtime.    [provider]  lamoTRIgine (LAMICTAL) 100 MG tablet Take 200 mg by mouth daily.     [provider]  lithium carbonate 300 MG capsule Take 450 mg by mouth daily.    [provider]  loratadine (CLARITIN) 10 MG tablet 10 mg daily.    [provider]  omeprazole (PRILOSEC) 20 MG capsule Take 20 mg by mouth every morning. 07/29/21   [provider]  QUEtiapine (SEROQUEL) 25 MG tablet Take 50 mg by mouth at bedtime. 01/06/21   [provider]  testosterone cypionate (DEPOTESTOSTERONE CYPIONATE) 200 MG/ML injection Inject 50 mg into the muscle every 7 (seven) days.    [provider]  topiramate (TOPAMAX) 50 MG tablet Take 25 mg by mouth 2 (two) times daily.    [provider]  valACYclovir (VALTREX) 500 MG tablet Take 500 mg by mouth daily.    [provider]      Allergies    Latuda [lurasidone], Nitrofurantoin, and Pineapple    Review of Systems   Review of Systems  Neurological:  Positive for seizures.  All other systems reviewed and are negative.   Physical Exam Updated Vital Signs BP (!) 138/93   Pulse 84   Temp 98.8 F (37.1 C) (Oral)   Resp 16   Ht 5\' 4"  (1.626 m)   SpO2 100%   BMI 24.72 kg/m  Physical Exam Vitals and nursing note reviewed.  Constitutional:      General: He is not in acute distress.    Appearance: He is not diaphoretic.  HENT:     Head: Normocephalic and atraumatic.  Mouth/Throat:     Pharynx: No oropharyngeal exudate.  Eyes:     General: No scleral icterus.    Conjunctiva/sclera: Conjunctivae normal.  Cardiovascular:     Rate and Rhythm: Normal rate and regular rhythm.     Pulses: Normal pulses.     Heart sounds: Normal heart sounds.  Pulmonary:     Effort: Pulmonary effort is normal. No respiratory distress.     Breath sounds: Normal breath sounds. No wheezing.  Abdominal:     General: Bowel sounds are normal.     Palpations: Abdomen is soft. There is no mass.     Tenderness: There is no abdominal tenderness. There is no guarding or rebound.   Musculoskeletal:        General: Normal range of motion.     Cervical back: Normal range of motion and neck supple.  Skin:    General: Skin is warm and dry.  Neurological:     Mental Status: He is alert.     Comments: Strength sensation intact to bilateral upper and lower extremities.  Negative pronator drift.  Able to ambulate without assistance or difficulty.  Psychiatric:        Behavior: Behavior normal.     ED Results / Procedures / Treatments   Labs (all labs ordered are listed, but only abnormal results are displayed) Labs Reviewed  CBC WITH DIFFERENTIAL/PLATELET  PREGNANCY, URINE  COMPREHENSIVE METABOLIC PANEL    EKG None  Radiology No results found.  Procedures Procedures    Medications Ordered in ED Medications - No data to display  ED Course/ Medical Decision Making/ A&P Clinical Course as of 09/14/22 1842  Tue Sep 14, 2022  1430 Re-evaluated and noted improvement of symptoms with treatment regimen. Discussed discharge treatment plan. Pt agreeable at this time. Pt appears safe for discharge. [SB]    Clinical Course User Index [SB] Wyvonne Carda A, PA-C                             Medical Decision Making Amount and/or Complexity of Data Reviewed Labs: ordered. Radiology: ordered.   Pt presents with concerns for unwitnessed seizure 4 days ago.  Has a history of psychological nonepileptic seizures.  Does not have neurology at this time.  Patient afebrile.  On exam patient without focal neurological deficits.  No acute cardiovascular respiratory exam findings.  Differential diagnosis includes electrolyte abnormality, hypoglycemia, anemia, intracranial abnormality.  Labs:  I ordered, and personally interpreted labs.  The pertinent results include:   CBC, CMP unremarkable Negative pregnancy urine  Imaging: I ordered imaging studies including CT head I independently visualized and interpreted imaging which showed: No intracranial abnormalities I  agree with the radiologist interpretation  Medications:  I ordered medication including IVF for symptom management Reevaluation of the patient after these medicines and interventions, I reevaluated the patient and found that they have improved I have reviewed the patients home medicines and have made adjustments as needed   Disposition: Presenting suspicious for dizziness.  Doubt at this time concerns for hypoglycemia, electrolyte abnormality, anemia, intracranial abnormality.  No witnessed seizure-like activity in the emergency department today.  Patient provided with ambulatory referral to neurology.  After consideration of the diagnostic results and the patients response to treatment, I feel that the patient would benefit from Discharge home. Supportive care measures and strict return precautions discussed with patient at bedside. Pt acknowledges and verbalizes understanding. Pt appears safe for discharge. Follow up  as indicated in discharge paperwork.    This chart was dictated using voice recognition software, Dragon. Despite the best efforts of this provider to proofread and correct errors, errors may still occur which can change documentation meaning.  Final Clinical Impression(s) / ED Diagnoses Final diagnoses:  Dizziness  Seizure [facility])    Rx / DC Orders ED Discharge Orders     None         Avarae Zwart A, PA-C 09/14/22 1845    Rozelle Logan, DO 09/15/22 1610

## 2022-09-14 NOTE — ED Notes (Signed)
Unsuccessful attempt to draw labs. 

## 2022-09-14 NOTE — Discharge Instructions (Signed)
It was a pleasure take care of you today!  There were no concerning emergent findings today on your lab or imaging studies.  Ensure to maintain fluid intake with water, tea, broth, soup, Pedialyte, Gatorade.  Maintain your scheduled follow-up appointment with your care team.  You will be provided with a referral to neurology, they will call and set up a follow-up appointment regarding today's ED visit.  Return to the emergency department for experience increasing/worsening symptoms.

## 2022-09-14 NOTE — ED Triage Notes (Signed)
Patient presents to ED via POV from home. Reports seizure on Friday. States "my healthcare team says its psychological, non-epilepic." Endorses dizziness.

## 2022-09-15 DIAGNOSIS — F319 Bipolar disorder, unspecified: Secondary | ICD-10-CM | POA: Diagnosis not present

## 2022-09-15 DIAGNOSIS — F900 Attention-deficit hyperactivity disorder, predominantly inattentive type: Secondary | ICD-10-CM | POA: Diagnosis not present

## 2022-09-15 DIAGNOSIS — F649 Gender identity disorder, unspecified: Secondary | ICD-10-CM | POA: Diagnosis not present

## 2022-09-15 DIAGNOSIS — F411 Generalized anxiety disorder: Secondary | ICD-10-CM | POA: Diagnosis not present

## 2022-09-18 DIAGNOSIS — F3171 Bipolar disorder, in partial remission, most recent episode hypomanic: Secondary | ICD-10-CM | POA: Diagnosis not present

## 2022-09-22 DIAGNOSIS — I10 Essential (primary) hypertension: Secondary | ICD-10-CM | POA: Diagnosis not present

## 2022-09-22 DIAGNOSIS — F3171 Bipolar disorder, in partial remission, most recent episode hypomanic: Secondary | ICD-10-CM | POA: Diagnosis not present

## 2022-09-22 DIAGNOSIS — J309 Allergic rhinitis, unspecified: Secondary | ICD-10-CM | POA: Diagnosis not present

## 2022-09-22 DIAGNOSIS — R569 Unspecified convulsions: Secondary | ICD-10-CM | POA: Diagnosis not present

## 2022-09-29 ENCOUNTER — Encounter: Payer: Self-pay | Admitting: Neurology

## 2022-10-01 DIAGNOSIS — F3171 Bipolar disorder, in partial remission, most recent episode hypomanic: Secondary | ICD-10-CM | POA: Diagnosis not present

## 2022-10-06 DIAGNOSIS — F649 Gender identity disorder, unspecified: Secondary | ICD-10-CM | POA: Diagnosis not present

## 2022-10-06 DIAGNOSIS — F411 Generalized anxiety disorder: Secondary | ICD-10-CM | POA: Diagnosis not present

## 2022-10-06 DIAGNOSIS — F319 Bipolar disorder, unspecified: Secondary | ICD-10-CM | POA: Diagnosis not present

## 2022-10-06 DIAGNOSIS — F900 Attention-deficit hyperactivity disorder, predominantly inattentive type: Secondary | ICD-10-CM | POA: Diagnosis not present

## 2022-10-07 DIAGNOSIS — F3171 Bipolar disorder, in partial remission, most recent episode hypomanic: Secondary | ICD-10-CM | POA: Diagnosis not present

## 2022-10-14 DIAGNOSIS — F3171 Bipolar disorder, in partial remission, most recent episode hypomanic: Secondary | ICD-10-CM | POA: Diagnosis not present

## 2022-10-20 ENCOUNTER — Ambulatory Visit (INDEPENDENT_AMBULATORY_CARE_PROVIDER_SITE_OTHER): Payer: BC Managed Care – PPO | Admitting: Neurology

## 2022-10-20 ENCOUNTER — Encounter: Payer: Self-pay | Admitting: Neurology

## 2022-10-20 VITALS — BP 139/81 | HR 79 | Ht 64.5 in | Wt 132.6 lb

## 2022-10-20 DIAGNOSIS — R253 Fasciculation: Secondary | ICD-10-CM | POA: Diagnosis not present

## 2022-10-20 DIAGNOSIS — R2990 Unspecified symptoms and signs involving the nervous system: Secondary | ICD-10-CM | POA: Diagnosis not present

## 2022-10-20 NOTE — Patient Instructions (Signed)
Good to meet you.  Schedule MRI brain with and without contrast  2. Schedule 1-hour EEG. If normal, we will do a 3-day home EEG to try to capture episodes  3. Follow-up after tests, call for any changes

## 2022-10-20 NOTE — Progress Notes (Signed)
NEUROLOGY CONSULTATION NOTE  Katie [name] MRN: [id number] DOB: Mar 26, 1986  Referring provider: Dr. Maryelizabeth Rowan Primary care provider: Dow Adolph, FNP  Reason for consult:  seizure-like activity  Dear Dr Duanne Guess:  Thank you for your kind referral of Katie [name] for consultation of the above symptoms. Although his history is well known to you, please allow me to reiterate it for the purpose of our medical record. He is alone in the office today. Records and images were personally reviewed where available.   HISTORY OF PRESENT ILLNESS: This is a 37 year old ambidextrous left hand dominant transgender man with a history of asthma, bipolar disorder, anxiety, presenting for evaluation of seizure-like activity. He was in the ER on 09/14/2022 due to concern for unwitnessed seizure 4 days prior. CBC, CMP normal. Head CT without contrast did not show any acute changes.   He reports that "life has been completely bonkers for a while now." A couple of years ago, he started having episodes of sudden onset extreme fatigue, "felt like I was made up of steam." They would last 15-40 minutes and he would have to lay down, physically unable to stay awake. No loss of consciousness, he would go to sleep. It would be incredible hard to wake up, he feels disoriented, confused, foggy, very slow to move. Since the beginning of the year, these have increased in frequency. Around 1-2 weeks prior to ER visit, he would fall asleep freezing cold despite normal room temperature. He reports a diagnosis of OSDD (Other Specified Dissociative Disorder) and recalls he was having a conversation with "one of my alters in my head," when he started having a weird sensation on the left side starting on his face. He felt foggy and confused, he felt the left eye twitch, then the left side of his face twitch. There was tingling on the left side of his head with a buzzy feeling in his head. The voice said what is going on,  head twitched back, then his left arm twitched feeling like his brain sent impulses. He stayed awake and alert on the right side of his brain but felt panic because "I was not there on the left." He states that "when we were both present again, he nudged me to move my body" and he was able to move. This episode lasted under an hour. He reports that sometimes he will have physical or sensory reactions or sound or light, he would be in a car and certain songs would trigger a "brain-based spontaneous orgasm" lasting 5-10 minutes. This can also occur with certain patterns of light and there would be a euphoria effect feeling "high as balls right now." He was in an arcade early June where there were bright lights and he started feeling nauseated and head did not feel right. There was a sensation on his face like an acupuncture electrical sensation, then his left leg started to go numb with tingling, followed by the left arm. If he lays down when the fatigue hits, he is more susceptible to these events.  When asked about any staring spells, he reports he grew up in a space that was ongoing toxic stress, "I am dissociative, 10% fully present, always dissociative." He zones in and out due to ADHD but denies any unresponsiveness. He has gone catatonic when extremely stressed, "really not here for a couple of seconds." He describes a level of "hyperphantasia, dissociation/derealization" and does not remember not feeling like that. He wonders about idiopathic hypersomnia and has  had several sleep studies. He states the "person on right side of me is able to stay awake, half of my brain is asleep, then the awake side tells me my tongue slides backward." If he takes a nap during the day, his chest pulses adrenaline and cortisol, "which is probably why you feel out of breath, having panic attacks in your sleep," waking up gasping for breath.   Recently he has been having headaches localized around the left eye. When the  seizure-like activity started, he felt like his left eyeball was "overfilled" with tight internal pressure/aching, pain in both temples. There was occasional nausea, no photo/phonophobia. There is occasional dizziness where he feels "hypoxic and diaphanous, like there is not enough oxygen in my blood." He notes the temperature in his fingers are off, more on the left 3rd and 4th digits. He does not sleep well, with medication changes, he can go fine with 7-8 hours of being in bed and still feel tired. His mind has "back chatter, doing processes, not racing or conversations, it never stops." He was diagnosed with diabetes insipidus and is tapering off Lithium, last day on Friday. No change in symptoms with Lithium wean that started in January. He is also on Lamotrigine 200mg  daily and Topiramate 25mg  BID for the past 5 years for OCD. He recalls being evaluated for tremors in his teens, reported to be a reaction to severe pain from menstrual cycle. He reports his father had "weird sleep issues, vivid dreams." His paternal grandfather also had full conversations in his sleep. His maternal grandmother was bipolar and had seizures. He recalls a head injury at age 28 from a fall. He had a concussion last year. No history of febrile convulsions, CNS infections, neurosurgical procedures. He lives in a hotel, prior to this he was in a "very moldy home."    PAST MEDICAL HISTORY: Past Medical History:  Diagnosis Date   Anxiety    Asthma    Bipolar 1 disorder (HCC)    Depression    Eczema    Excessive daytime sleepiness    Fibroid    Genital herpes    GERD (gastroesophageal reflux disease)    Vocal cord anomaly    Spastic vocal cords and sensitive trachea per ENT, persistent severe cough with irritation per pt    PAST SURGICAL HISTORY: Past Surgical History:  Procedure Laterality Date   DILITATION & CURRETTAGE/HYSTROSCOPY WITH NOVASURE ABLATION N/A 12/07/2021   Procedure: DILATATION & CURETTAGE/HYSTEROSCOPY  WITH NOVASURE ABLATION;  Surgeon: Ranae Pila, MD;  Location: [facility];  Service: Gynecology;  Laterality: N/A;   IUD REMOVAL N/A 12/07/2021   Procedure: INTRAUTERINE DEVICE (IUD) REMOVAL;  Surgeon: Ranae Pila, MD;  Location: [facility];  Service: Gynecology;  Laterality: N/A;   LAPAROSCOPIC TUBAL LIGATION Bilateral 12/07/2021   Procedure: LAPAROSCOPIC BILATERAL TUBAL LIGATION WITH CAUTERY;  Surgeon: Ranae Pila, MD;  Location: [facility];  Service: Gynecology;  Laterality: Bilateral;   NO PAST SURGERIES     OVARIAN CYST REMOVAL N/A 12/07/2021   Procedure: FULGURATION OF ENDOMETRIOSIS;  Surgeon: Ranae Pila, MD;  Location: [facility];  Service: Gynecology;  Laterality: N/A;    MEDICATIONS: Current Outpatient Medications on File Prior to Visit  Medication Sig Dispense Refill   acyclovir (ZOVIRAX) 400 MG tablet Take 400 mg by mouth as needed.     albuterol (VENTOLIN HFA) 108 (90 Base) MCG/ACT inhaler Inhale 2 puffs into the lungs every 4 (  four) hours as needed.     amphetamine-dextroamphetamine (ADDERALL XR) 10 MG 24 hr capsule Take 10 mg by mouth daily.     amphetamine-dextroamphetamine (ADDERALL) 5 MG tablet Take 5 mg by mouth as needed.     Cholecalciferol (VITAMIN D3) 1.25 MG (50000 UT) CAPS Take by mouth.     cloNIDine HCl (KAPVAY) 0.1 MG TB12 ER tablet Take 0.1 mg by mouth at bedtime.     diazepam (VALIUM) 2 MG tablet Take 1 mg by mouth at bedtime.     lamoTRIgine (LAMICTAL) 200 MG tablet Take 200 mg by mouth daily.     lithium carbonate 300 MG capsule Take 450 mg by mouth daily.     Multiple Vitamins-Minerals (MENS MULTI VITAMIN & MINERAL PO) Take by mouth.     omeprazole (PRILOSEC) 20 MG capsule Take 20 mg by mouth every morning.     QUEtiapine (SEROQUEL) 25 MG tablet Take 50 mg by mouth at bedtime.     testosterone cypionate (DEPOTESTOSTERONE CYPIONATE) 200 MG/ML injection Inject 50 mg  into the muscle every 7 (seven) days.     topiramate (TOPAMAX) 50 MG tablet Take 25 mg by mouth 2 (two) times daily.     valACYclovir (VALTREX) 500 MG tablet Take 500 mg by mouth daily.     loratadine (CLARITIN) 10 MG tablet 10 mg daily. (Patient not taking: Reported on 10/20/2022)     No current facility-administered medications on file prior to visit.    ALLERGIES: Allergies  Allergen Reactions   Banana Itching   Latex Itching   Latuda [Lurasidone] Nausea And Vomiting    Projectile vomiting   Nitrofurantoin Nausea And Vomiting and Other (See Comments)    Other reaction(s): Abdominal Pain, GI Intolerance   Pineapple Swelling    Tingling, swelling in lips    FAMILY HISTORY: Family History  Problem Relation Age of Onset   Arrhythmia Mother    Hypertension Father    Arthritis/Rheumatoid Sister    Mitral valve prolapse Brother    Arrhythmia Maternal Grandmother    Diabetes Paternal Grandmother    Hypertension Paternal Grandmother    Diabetes Paternal Grandfather    Hypertension Paternal Grandfather     SOCIAL HISTORY: Social History   Socioeconomic History   Marital status: Married    Spouse name: Not on file   Number of children: 1   Years of education: Not on file   Highest education level: Not on file  Occupational History   Not on file  Tobacco Use   Smoking status: Never   Smokeless tobacco: Never  Vaping Use   Vaping Use: Never used  Substance and Sexual Activity   Alcohol use: Not Currently   Drug use: Never   Sexual activity: Yes    Birth control/protection: I.U.D.    Comment: 1ST INTERCOURSE- 18, PARTNERS- 4   Other Topics Concern   Not on file  Social History Narrative      Are you right handed or left handed? 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 no   Who lives with you? Lives with family son (5)   What type of home do you live in: 1 story or 2 story?  Lives in hotel no  steps       Social Determinants of Health   Financial Resource Strain: Not on file  Food Insecurity: Not on file  Transportation Needs: Not on  file  Physical Activity: Not on file  Stress: Not on file  Social Connections: Not on file  Intimate Partner Violence: Not on file     PHYSICAL EXAM: Vitals:   10/20/22 0850  BP: 139/81  Pulse: 79  SpO2: 98%   General: No acute distress Head:  Normocephalic/atraumatic Skin/Extremities: No rash, no edema Neurological Exam: Mental status: alert and awake, no dysarthria or aphasia, Fund of knowledge is appropriate.  Attention and concentration are normal, 5/5 WORLD backward.  Recent and remote memory intact, 3/3 delayed recall. Cranial nerves: CN I: not tested CN II: pupils equal, round, visual fields intact CN III, IV, VI:  full range of motion, no nystagmus, no ptosis CN V: facial sensation intact CN VII: upper and lower face symmetric CN VIII: hearing intact to conversation Bulk & Tone: normal, no fasciculations. Motor: 5/5 throughout with no pronator drift. Sensation: intact to light touch, cold, pin, vibration sense throughout except for decreased pin on the left 3rd digit. No extinction to double simultaneous stimulation.  Romberg test negative Deep Tendon Reflexes: +2 throughout Cerebellar: no incoordination on finger to nose, heel to shin. No dysdiadochokinesia Gait: narrow-based and steady, able to tandem walk adequately. Tremor: none   IMPRESSION: This is a 37 year old ambidextrous left hand dominant transgender man with a history of asthma, bipolar disorder, anxiety, presenting for evaluation of seizure-like activity. He reports left-sided twitching and numbness/weakness. He also has multiple other symptoms suggestive of psychiatric etiology. From a neurological standpoint, we discussed doing a brain MRI with and without contrast and 1-hour EEG. If normal, we will do a 72-hour EEG to characterize symptoms. Follow-up after  tests, call for any changes.    Thank you for allowing me to participate in the care of this patient. Please do not hesitate to call for any questions or concerns.   Patrcia Dolly, M.D.  CC: Dow Adolph, FNP, Dr. Duanne Guess

## 2022-10-21 ENCOUNTER — Other Ambulatory Visit: Payer: BC Managed Care – PPO

## 2022-10-21 DIAGNOSIS — F649 Gender identity disorder, unspecified: Secondary | ICD-10-CM | POA: Diagnosis not present

## 2022-10-21 DIAGNOSIS — F411 Generalized anxiety disorder: Secondary | ICD-10-CM | POA: Diagnosis not present

## 2022-10-21 DIAGNOSIS — F900 Attention-deficit hyperactivity disorder, predominantly inattentive type: Secondary | ICD-10-CM | POA: Diagnosis not present

## 2022-10-21 DIAGNOSIS — F319 Bipolar disorder, unspecified: Secondary | ICD-10-CM | POA: Diagnosis not present

## 2022-10-22 DIAGNOSIS — Z79899 Other long term (current) drug therapy: Secondary | ICD-10-CM | POA: Diagnosis not present

## 2022-10-22 DIAGNOSIS — F3171 Bipolar disorder, in partial remission, most recent episode hypomanic: Secondary | ICD-10-CM | POA: Diagnosis not present

## 2022-10-22 DIAGNOSIS — F640 Transsexualism: Secondary | ICD-10-CM | POA: Diagnosis not present

## 2022-10-25 ENCOUNTER — Ambulatory Visit: Payer: BC Managed Care – PPO | Admitting: Neurology

## 2022-10-25 DIAGNOSIS — R253 Fasciculation: Secondary | ICD-10-CM

## 2022-10-25 DIAGNOSIS — R2990 Unspecified symptoms and signs involving the nervous system: Secondary | ICD-10-CM

## 2022-10-25 NOTE — Progress Notes (Signed)
EEG complete - results pending 

## 2022-10-28 DIAGNOSIS — N251 Nephrogenic diabetes insipidus: Secondary | ICD-10-CM | POA: Diagnosis not present

## 2022-10-28 DIAGNOSIS — F640 Transsexualism: Secondary | ICD-10-CM | POA: Diagnosis not present

## 2022-10-28 DIAGNOSIS — F649 Gender identity disorder, unspecified: Secondary | ICD-10-CM | POA: Diagnosis not present

## 2022-10-28 DIAGNOSIS — Z79899 Other long term (current) drug therapy: Secondary | ICD-10-CM | POA: Diagnosis not present

## 2022-10-28 DIAGNOSIS — F3162 Bipolar disorder, current episode mixed, moderate: Secondary | ICD-10-CM | POA: Diagnosis not present

## 2022-10-29 DIAGNOSIS — F3171 Bipolar disorder, in partial remission, most recent episode hypomanic: Secondary | ICD-10-CM | POA: Diagnosis not present

## 2022-11-03 DIAGNOSIS — F3171 Bipolar disorder, in partial remission, most recent episode hypomanic: Secondary | ICD-10-CM | POA: Diagnosis not present

## 2022-11-12 ENCOUNTER — Encounter: Payer: Self-pay | Admitting: Neurology

## 2022-11-12 DIAGNOSIS — F3171 Bipolar disorder, in partial remission, most recent episode hypomanic: Secondary | ICD-10-CM | POA: Diagnosis not present

## 2022-11-15 ENCOUNTER — Ambulatory Visit: Admission: RE | Admit: 2022-11-15 | Payer: BC Managed Care – PPO | Source: Ambulatory Visit

## 2022-11-15 DIAGNOSIS — F649 Gender identity disorder, unspecified: Secondary | ICD-10-CM | POA: Diagnosis not present

## 2022-11-15 DIAGNOSIS — R2990 Unspecified symptoms and signs involving the nervous system: Secondary | ICD-10-CM

## 2022-11-15 DIAGNOSIS — R253 Fasciculation: Secondary | ICD-10-CM

## 2022-11-15 DIAGNOSIS — F3162 Bipolar disorder, current episode mixed, moderate: Secondary | ICD-10-CM | POA: Diagnosis not present

## 2022-11-15 DIAGNOSIS — R29818 Other symptoms and signs involving the nervous system: Secondary | ICD-10-CM | POA: Diagnosis not present

## 2022-11-15 DIAGNOSIS — Z79899 Other long term (current) drug therapy: Secondary | ICD-10-CM | POA: Diagnosis not present

## 2022-11-15 DIAGNOSIS — N251 Nephrogenic diabetes insipidus: Secondary | ICD-10-CM | POA: Diagnosis not present

## 2022-11-15 DIAGNOSIS — F640 Transsexualism: Secondary | ICD-10-CM | POA: Diagnosis not present

## 2022-11-15 DIAGNOSIS — O9279 Other disorders of lactation: Secondary | ICD-10-CM | POA: Diagnosis not present

## 2022-11-15 MED ORDER — GADOPICLENOL 0.5 MMOL/ML IV SOLN
6.0000 mL | Freq: Once | INTRAVENOUS | Status: AC | PRN
  Administered 2022-11-15: 6 mL via INTRAVENOUS

## 2022-11-15 NOTE — Procedures (Signed)
ELECTROENCEPHALOGRAM REPORT  Date of Study: 10/25/2022  Patient's Name: Katie [name] MRN: [id number] Date of Birth: 08-10-1985  Referring Provider: Dr. Patrcia Dolly  Clinical History: This is a 37 year old transgender man with seizure-like activity, left-sided twitching, numbness/weakness. EEG for classification.  Medications: Lamotrigine, Topiramate, Lithium, Seroquel, Clonidine, Adderall  Technical Summary: A multichannel digital 1-hour EEG recording measured by the international 10-20 system with electrodes applied with paste and impedances below 5000 ohms performed in our laboratory with EKG monitoring in an awake and drowsy patient.  Hyperventilation and photic stimulation were performed.  The digital EEG was referentially recorded, reformatted, and digitally filtered in a variety of bipolar and referential montages for optimal display.    Description: The patient is awake and drowsy during the recording.  During maximal wakefulness, there is a symmetric, medium voltage 10 Hz posterior dominant rhythm that attenuates with eye opening.  The record is symmetric.  During drowsiness, there is an increase in theta slowing of the background.  Sleep was not captured. Hyperventilation and photic stimulation did not elicit any abnormalities.  There were no epileptiform discharges or electrographic seizures seen.    EKG lead was unremarkable.  Impression: This 1-hour awake and drowsy EEG is normal.    Clinical Correlation: A normal EEG does not exclude a clinical diagnosis of epilepsy.  If further clinical questions remain, prolonged EEG may be helpful.  Clinical correlation is advised.   Patrcia Dolly, M.D.

## 2022-11-17 ENCOUNTER — Emergency Department (HOSPITAL_BASED_OUTPATIENT_CLINIC_OR_DEPARTMENT_OTHER)
Admission: EM | Admit: 2022-11-17 | Discharge: 2022-11-17 | Disposition: A | Discharge status: Home / Self Care | Payer: BC Managed Care – PPO | Attending: Emergency Medicine | Admitting: Emergency Medicine

## 2022-11-17 ENCOUNTER — Other Ambulatory Visit: Payer: Self-pay

## 2022-11-17 ENCOUNTER — Encounter (HOSPITAL_BASED_OUTPATIENT_CLINIC_OR_DEPARTMENT_OTHER): Payer: Self-pay | Admitting: Emergency Medicine

## 2022-11-17 DIAGNOSIS — J45909 Unspecified asthma, uncomplicated: Secondary | ICD-10-CM | POA: Diagnosis not present

## 2022-11-17 DIAGNOSIS — R1030 Lower abdominal pain, unspecified: Secondary | ICD-10-CM

## 2022-11-17 DIAGNOSIS — R350 Frequency of micturition: Secondary | ICD-10-CM | POA: Diagnosis not present

## 2022-11-17 DIAGNOSIS — E876 Hypokalemia: Secondary | ICD-10-CM

## 2022-11-17 LAB — URINALYSIS, ROUTINE W REFLEX MICROSCOPIC
Bilirubin Urine: NEGATIVE
Glucose, UA: NEGATIVE mg/dL
Hgb urine dipstick: NEGATIVE
Ketones, ur: NEGATIVE mg/dL
Nitrite: NEGATIVE
Protein, ur: NEGATIVE mg/dL
Specific Gravity, Urine: 1.015 (ref 1.005–1.030)
pH: 7 (ref 5.0–8.0)

## 2022-11-17 LAB — COMPREHENSIVE METABOLIC PANEL
ALT: 16 U/L (ref 0–44)
AST: 19 U/L (ref 15–41)
Albumin: 3.7 g/dL (ref 3.5–5.0)
Alkaline Phosphatase: 66 U/L (ref 38–126)
Anion gap: 9 (ref 5–15)
BUN: 11 mg/dL (ref 6–20)
CO2: 26 mmol/L (ref 22–32)
Calcium: 9.1 mg/dL (ref 8.9–10.3)
Chloride: 105 mmol/L (ref 98–111)
Creatinine, Ser: 0.83 mg/dL (ref 0.44–1.00)
GFR, Estimated: >60 mL/min (ref >60)
Glucose, Bld: 99 mg/dL (ref 70–99)
Potassium: 3 mmol/L — ABNORMAL LOW (ref 3.5–5.1)
Sodium: 140 mmol/L (ref 135–145)
Total Bilirubin: 0.4 mg/dL (ref 0.3–1.2)
Total Protein: 7.1 g/dL (ref 6.5–8.1)

## 2022-11-17 LAB — URINALYSIS, MICROSCOPIC (REFLEX): RBC / HPF: NONE SEEN RBC/hpf (ref 0–5)

## 2022-11-17 LAB — CBC WITH DIFFERENTIAL/PLATELET
Abs Immature Granulocytes: 0.02 10*3/uL (ref 0.00–0.07)
Basophils Absolute: 0 10*3/uL (ref 0.0–0.1)
Basophils Relative: 1 %
Eosinophils Absolute: 0.1 10*3/uL (ref 0.0–0.5)
Eosinophils Relative: 1 %
HCT: 43.5 % (ref 36.0–46.0)
Hemoglobin: 13.7 g/dL (ref 12.0–15.0)
Immature Granulocytes: 0 %
Lymphocytes Relative: 36 %
Lymphs Abs: 3.2 10*3/uL (ref 0.7–4.0)
MCH: 29.1 pg (ref 26.0–34.0)
MCHC: 31.5 g/dL (ref 30.0–36.0)
MCV: 92.4 fL (ref 80.0–100.0)
Monocytes Absolute: 0.5 10*3/uL (ref 0.1–1.0)
Monocytes Relative: 6 %
Neutro Abs: 5 10*3/uL (ref 1.7–7.7)
Neutrophils Relative %: 56 %
Platelets: 374 10*3/uL (ref 150–400)
RBC: 4.71 MIL/uL (ref 3.87–5.11)
RDW: 12.4 % (ref 11.5–15.5)
WBC: 8.8 10*3/uL (ref 4.0–10.5)
nRBC: 0 % (ref 0.0–0.2)

## 2022-11-17 LAB — PREGNANCY, URINE: Preg Test, Ur: NEGATIVE

## 2022-11-17 LAB — LIPASE, BLOOD: Lipase: 33 U/L (ref 11–51)

## 2022-11-17 LAB — MAGNESIUM: Magnesium: 2 mg/dL (ref 1.7–2.4)

## 2022-11-17 LAB — CBG MONITORING, ED: Glucose-Capillary: 104 mg/dL — ABNORMAL HIGH (ref 70–99)

## 2022-11-17 MED ORDER — POTASSIUM CHLORIDE CRYS ER 20 MEQ PO TBCR: 40.0000 meq | EXTENDED_RELEASE_TABLET | Freq: Every day | ORAL | 0 refills | Status: AC

## 2022-11-17 MED ORDER — IBUPROFEN 800 MG PO TABS: 800.0000 mg | ORAL_TABLET | Freq: Three times a day (TID) | ORAL | 0 refills | Status: AC | PRN

## 2022-11-17 MED ORDER — POTASSIUM CHLORIDE CRYS ER 20 MEQ PO TBCR
40.0000 meq | EXTENDED_RELEASE_TABLET | Freq: Once | ORAL | Status: AC
  Administered 2022-11-17: 40 meq via ORAL
  Filled 2022-11-17: qty 2

## 2022-11-17 MED ORDER — DICYCLOMINE HCL 20 MG PO TABS: 20.0000 mg | ORAL_TABLET | Freq: Three times a day (TID) | ORAL | 0 refills | Status: AC | PRN

## 2022-11-17 MED ORDER — SODIUM CHLORIDE 0.9 % IV BOLUS
500.0000 mL | Freq: Once | INTRAVENOUS | Status: AC
  Administered 2022-11-17: 500 mL via INTRAVENOUS

## 2022-11-17 NOTE — ED Provider Notes (Signed)
Emergency Department Provider Note   I have reviewed the triage vital signs and the nursing notes.   HISTORY  Chief Complaint Mutiple Complaints    HPI Katie [name] is a 37 y.o. adult presents to the emergency department for evaluation of increased thirst, cramping lower abdominal pain, nipple discharge, and fatigue.  Patient is currently on testosterone managed at an outside facility.  Patient has been compliant with her medications.  No chest pain or shortness of breath.  No fevers or chills.  Patient reports that the hormone levels were recently checked by their prescriber and were within normal limits.  They have had issues with elevated estrogen in the past with adjustment of the testosterone dosing in late June.  The patient has also tapered off of lithium successfully which was thought to be contributing to these symptoms in the past.    Past Medical History:  Diagnosis Date   Anxiety    Asthma    Bipolar 1 disorder (HCC)    Depression    Eczema    Excessive daytime sleepiness    Fibroid    Genital herpes    GERD (gastroesophageal reflux disease)    Vocal cord anomaly    Spastic vocal cords and sensitive trachea per ENT, persistent severe cough with irritation per pt    Review of Systems  Constitutional: No fever/chills. Positive fatigue.  Cardiovascular: Denies chest pain. Respiratory: Denies shortness of breath. Gastrointestinal: Positive lower abdominal pain.  No nausea, no vomiting.  No diarrhea.  No constipation. Genitourinary: Negative for dysuria. Increased urination.  Musculoskeletal: Negative for back pain. Skin: Negative for rash. Neurological: Negative for headaches, focal weakness or numbness.  ____________________________________________   PHYSICAL EXAM:  VITAL SIGNS: ED Triage Vitals  Encounter Vitals Group     BP 11/17/22 1834 130/87     Pulse Rate 11/17/22 1834 84     Resp 11/17/22 1834 18     Temp 11/17/22 1834 98.2 F (36.8 C)      Temp src --      SpO2 11/17/22 1834 99 %     Weight 11/17/22 1835 128 lb (58.1 kg)     Height 11/17/22 1835 5' 4.5" (1.638 m)   Constitutional: Alert and oriented. Well appearing and in no acute distress. Eyes: Conjunctivae are normal.  Head: Atraumatic. Nose: No congestion/rhinnorhea. Mouth/Throat: Mucous membranes are moist.  Neck: No stridor.   Cardiovascular: Normal rate, regular rhythm. Good peripheral circulation. Grossly normal heart sounds.   Respiratory: Normal respiratory effort.  No retractions. Lungs CTAB. Gastrointestinal: Soft and nontender. No distention.  Musculoskeletal: No lower extremity tenderness nor edema. No gross deformities of extremities. Neurologic:  Normal speech and language. No gross focal neurologic deficits are appreciated.  Skin:  Skin is warm, dry and intact. No rash noted.  ____________________________________________   LABS (all labs ordered are listed, but only abnormal results are displayed)  Labs Reviewed  COMPREHENSIVE METABOLIC PANEL - Abnormal; Notable for the following components:      Result Value   Potassium 3.0 (*)    All other components within normal limits  URINALYSIS, ROUTINE W REFLEX MICROSCOPIC - Abnormal; Notable for the following components:   Leukocytes,Ua SMALL (*)    All other components within normal limits  URINALYSIS, MICROSCOPIC (REFLEX) - Abnormal; Notable for the following components:   Bacteria, UA RARE (*)    All other components within normal limits  CBG MONITORING, ED - Abnormal; Notable for the following components:   Glucose-Capillary 104 (*)  All other components within normal limits  LIPASE, BLOOD  CBC WITH DIFFERENTIAL/PLATELET  PREGNANCY, URINE  MAGNESIUM   ____________________________________________   PROCEDURES  Procedure(s) performed:   Procedures  None  ____________________________________________   INITIAL IMPRESSION / ASSESSMENT AND PLAN / ED COURSE  Pertinent labs & imaging  results that were available during my care of the patient were reviewed by me and considered in my medical decision making (see chart for details).   This patient is Presenting for Evaluation of abdominal pain, which does require a range of treatment options, and is a complaint that involves a high risk of morbidity and mortality.  The Differential Diagnoses includes but is not exclusive to acute cholecystitis, intrathoracic causes for epigastric abdominal pain, gastritis, duodenitis, pancreatitis, small bowel or large bowel obstruction, abdominal aortic aneurysm, hernia, gastritis, etc.   Critical Interventions-    Medications  sodium chloride 0.9 % bolus 500 mL (0 mLs Intravenous Stopped 11/17/22 2026)  potassium chloride SA (KLOR-CON M) CR tablet 40 mEq (40 mEq Oral Given 11/17/22 2026)    Reassessment after intervention: symptoms improved.    I decided to review pertinent External Data, and in summary patient managed in the [facility] system for gender dysphoria and hormone therapy.    Clinical Laboratory Tests Ordered, included potassium slightly low at 3.0 with normal magnesium.  LFTs and bilirubin along with lipase normal.  UA negative. Pregnancy negative. CBC without acute abnormality.   Radiologic Tests: Considered abdominal/pelvic imaging but symptoms have been ongoing and more diffuse/alternating.  Does not seem consistent with acute ovarian torsion.  Abdominal exam is reassuring.  Doubt acute surgical process.  Defer imaging for now.   Cardiac Monitor Tracing which shows NSR.   Social Determinants of Health Risk patient is a non-smoker.   Medical Decision Making: Summary:  Patient presents emergency department for evaluation of lower abdominal cramping pain without focal tenderness along with fatigue and increased urination.  Some of the nipple discharge symptoms seem consistent with hormonal imbalance but patient had lab work 2 days prior with prolactin level normal and estradiol  within normal limits.  Testosterone elevated to 796.  LH and FSH normal.   Reevaluation with update and discussion with patient. Labs are overall reassuring. Advised close follow up with PCP and team at [facility]. Patient with close follow up with Gyn in August for ongoing pain. Advised to keep this appointment with strict ED return precautions.   Patient's presentation is most consistent with acute, uncomplicated illness.   Disposition: discharge  ____________________________________________  FINAL CLINICAL IMPRESSION(S) / ED DIAGNOSES  Final diagnoses:  Lower abdominal pain  Hypokalemia  Urine frequency     NEW OUTPATIENT MEDICATIONS STARTED DURING THIS VISIT:  Discharge Medication List as of 11/17/2022  8:16 PM     START taking these medications   Details  dicyclomine (BENTYL) 20 MG tablet Take 1 tablet (20 mg total) by mouth 3 (three) times daily as needed for spasms., Starting Wed 11/17/2022, Normal    ibuprofen (ADVIL) 800 MG tablet Take 1 tablet (800 mg total) by mouth every 8 (eight) hours as needed for moderate pain., Starting Wed 11/17/2022, Normal    potassium chloride SA (KLOR-CON M) 20 MEQ tablet Take 2 tablets (40 mEq total) by mouth daily., Starting Thu 11/18/2022, Normal        Note:  This document was prepared using Dragon voice recognition software and may include unintentional dictation errors.  Alona Bene, MD, FACEP Emergency Medicine    Shirel Mallis, Arlyss Repress, MD  11/17/22 2213  

## 2022-11-17 NOTE — ED Triage Notes (Addendum)
Pt reports increased thirst (hx of diabetes insipidus); c/o kidney pain; also reports discharge from bilateral nipples a few days ago and breasts are larger than normal; also c/o pain to bil "ovaries"; sts he is chemically female, but has female organs

## 2022-11-17 NOTE — Discharge Instructions (Signed)
We did not find a clear cause for your symptoms today.  Your potassium is slightly low and I am replacing.  I would like you to follow closely with your primary care doctor, GYN, as well as your team at [facility] managing your testosterone.

## 2022-11-18 ENCOUNTER — Telehealth: Payer: Self-pay

## 2022-11-18 ENCOUNTER — Emergency Department (HOSPITAL_COMMUNITY)
Admission: EM | Admit: 2022-11-18 | Discharge: 2022-11-18 | Disposition: A | Discharge status: Home / Self Care | Payer: BC Managed Care – PPO | Attending: Emergency Medicine | Admitting: Emergency Medicine

## 2022-11-18 ENCOUNTER — Telehealth: Payer: Self-pay | Admitting: Neurology

## 2022-11-18 ENCOUNTER — Other Ambulatory Visit: Payer: Self-pay

## 2022-11-18 ENCOUNTER — Emergency Department (HOSPITAL_COMMUNITY): Payer: BC Managed Care – PPO

## 2022-11-18 ENCOUNTER — Emergency Department (HOSPITAL_COMMUNITY): Admission: EM | Admit: 2022-11-18 | Payer: BC Managed Care – PPO | Source: Home / Self Care

## 2022-11-18 ENCOUNTER — Encounter (HOSPITAL_COMMUNITY): Payer: Self-pay

## 2022-11-18 DIAGNOSIS — J45909 Unspecified asthma, uncomplicated: Secondary | ICD-10-CM | POA: Insufficient documentation

## 2022-11-18 DIAGNOSIS — Z91040 Latex allergy status: Secondary | ICD-10-CM | POA: Insufficient documentation

## 2022-11-18 DIAGNOSIS — R253 Fasciculation: Secondary | ICD-10-CM

## 2022-11-18 DIAGNOSIS — R0789 Other chest pain: Secondary | ICD-10-CM | POA: Diagnosis not present

## 2022-11-18 DIAGNOSIS — R079 Chest pain, unspecified: Secondary | ICD-10-CM | POA: Diagnosis not present

## 2022-11-18 DIAGNOSIS — R2990 Unspecified symptoms and signs involving the nervous system: Secondary | ICD-10-CM

## 2022-11-18 LAB — BASIC METABOLIC PANEL
Anion gap: 4 — ABNORMAL LOW (ref 5–15)
BUN: 8 mg/dL (ref 6–20)
CO2: 23 mmol/L (ref 22–32)
Calcium: 9 mg/dL (ref 8.9–10.3)
Chloride: 112 mmol/L — ABNORMAL HIGH (ref 98–111)
Creatinine, Ser: 0.89 mg/dL (ref 0.44–1.00)
GFR, Estimated: >60 mL/min (ref >60)
Glucose, Bld: 93 mg/dL (ref 70–99)
Potassium: 4 mmol/L (ref 3.5–5.1)
Sodium: 139 mmol/L (ref 135–145)

## 2022-11-18 LAB — CBC
HCT: 45 % (ref 36.0–46.0)
Hemoglobin: 13.8 g/dL (ref 12.0–15.0)
MCH: 28.9 pg (ref 26.0–34.0)
MCHC: 30.7 g/dL (ref 30.0–36.0)
MCV: 94.1 fL (ref 80.0–100.0)
Platelets: 360 10*3/uL (ref 150–400)
RBC: 4.78 MIL/uL (ref 3.87–5.11)
RDW: 12.4 % (ref 11.5–15.5)
WBC: 7.7 10*3/uL (ref 4.0–10.5)
nRBC: 0 % (ref 0.0–0.2)

## 2022-11-18 LAB — D-DIMER, QUANTITATIVE: D-Dimer, Quant: <0.27 ug/mL-FEU (ref 0.00–0.50)

## 2022-11-18 LAB — HCG, SERUM, QUALITATIVE: Preg, Serum: NEGATIVE

## 2022-11-18 LAB — TROPONIN I (HIGH SENSITIVITY)
Troponin I (High Sensitivity): <2 ng/L (ref <18)
Troponin I (High Sensitivity): <2 ng/L (ref <18)

## 2022-11-18 NOTE — ED Triage Notes (Signed)
Pt c/o non radiating chest pressure and SOB. Pt denies N/V. Pt is eupneic.

## 2022-11-18 NOTE — Telephone Encounter (Signed)
Patient has called to get results/KB

## 2022-11-18 NOTE — Telephone Encounter (Signed)
Pt called informed that brain MRI looked fine, no tumor, stroke, bleed, or inflammation seen. EEG was also normal. He would like to do the 3 day eeg

## 2022-11-18 NOTE — Telephone Encounter (Signed)
-----   Message from Van Clines sent at 11/17/2022  3:45 PM EDT ----- Pls let patient know the brain MRI looked fine, no tumor, stroke, bleed, or inflammation seen. EEG was also normal. We had talked about doing a 3-day EEG if office EEG was normal. If he would like to proceed, pls order 72-hour EEG. thanks

## 2022-11-18 NOTE — ED Provider Notes (Signed)
[place] [facility]ERGENC[facility] DEPARTMENT AT [facility] Provider Note   CSN: [id number] Arrival date & time: 11/18/22  1124     History  Chief Complaint  Patient presents with   Chest Pain    Bonney Norment is a 37 y.o. adult.  With past medical history of asthma, GERD, gender dysphoria on hormone therapy who presents to the emergency department with chest pain.  States he has had chest pain over the past 1 week. Describes the pain as central, heaviness and tightness that waxes and wanes. Non radiating. No associated with nausea or diaphoresis. Has had associated shortness of breath, worse today. Denies palpitations. No cough or fever. He also notes feeling extremely fatigued today. States this is not new and has been worked up but "no one has found anything."    Chest Pain Associated symptoms: shortness of breath   Associated symptoms: no cough        Home Medications Prior to Admission medications   Medication Sig Start Date End Date Taking? Authorizing Provider  acyclovir (ZOVIRAX) 400 MG tablet Take 400 mg by mouth as needed. 04/22/21   [provider]  albuterol (VENTOLIN HFA) 108 (90 Base) MCG/ACT inhaler Inhale 2 puffs into the lungs every 4 (four) hours as needed. 04/01/21   [provider]  amphetamine-dextroamphetamine (ADDERALL XR) 10 MG 24 hr capsule Take 10 mg by mouth daily. 06/18/21   [provider]  amphetamine-dextroamphetamine (ADDERALL) 5 MG tablet Take 5 mg by mouth as needed.    [provider]  Cholecalciferol (VITAMIN D3) 1.25 MG (50000 UT) CAPS Take by mouth.    [provider]  cloNIDine HCl (KAPVAY) 0.1 MG TB12 ER tablet Take 0.1 mg by mouth at bedtime. 05/31/22   [provider]  diazepam (VALIUM) 2 MG tablet Take 1 mg by mouth at bedtime.    [provider]  dicyclomine (BENTYL) 20 MG tablet Take 1 tablet (20 mg total) by mouth 3 (three) times daily as needed for spasms. 11/17/22   Long,  Arlyss Repress, MD  ibuprofen (ADVIL) 800 MG tablet Take 1 tablet (800 mg total) by mouth every 8 (eight) hours as needed for moderate pain. 11/17/22   Long, Arlyss Repress, MD  lamoTRIgine (LAMICTAL) 200 MG tablet Take 200 mg by mouth daily.    [provider]  lithium carbonate 300 MG capsule Take 450 mg by mouth daily.    [provider]  loratadine (CLARITIN) 10 MG tablet 10 mg daily. Patient not taking: Reported on 10/20/2022    [provider]  Multiple Vitamins-Minerals (MENS MULTI VITAMIN & MINERAL PO) Take by mouth.    [provider]  omeprazole (PRILOSEC) 20 MG capsule Take 20 mg by mouth every morning. 07/29/21   [provider]  potassium chloride SA (KLOR-CON M) 20 MEQ tablet Take 2 tablets (40 mEq total) by mouth daily. 11/18/22   Long, Arlyss Repress, MD  QUEtiapine (SEROQUEL) 25 MG tablet Take 50 mg by mouth at bedtime. 01/06/21   [provider]  testosterone cypionate (DEPOTESTOSTERONE CYPIONATE) 200 MG/ML injection Inject 50 mg into the muscle every 7 (seven) days.    [provider]  topiramate (TOPAMAX) 25 MG tablet Take 25 mg by mouth 2 (two) times daily.    [provider]  valACYclovir (VALTREX) 500 MG tablet Take 500 mg by mouth daily.    [provider]      Allergies    Banana, Latex, Latuda [lurasidone], Nitrofurantoin, and Pineapple  Review of Systems   Review of Systems  Respiratory:  Positive for shortness of breath. Negative for cough.   Cardiovascular:  Positive for chest pain.  All other systems reviewed and are negative.   Physical Exam Updated Vital Signs BP (!) 138/90 (BP Location: Right Arm)   Pulse 83   Temp 98.5 F (36.9 C) (Oral)   Resp 16   Ht 5' 4.5" (1.638 m)   Wt 58.1 kg   SpO2 99%   BMI 21.65 kg/m  Physical Exam Vitals and nursing note reviewed.  Constitutional:      General: He is not in acute distress.    Appearance: Normal appearance. He is well-developed. He is not  ill-appearing or toxic-appearing.  HENT:     Head: Normocephalic.  Eyes:     General: No scleral icterus.    Extraocular Movements: Extraocular movements intact.  Cardiovascular:     Rate and Rhythm: Normal rate and regular rhythm.     Pulses:          Radial pulses are 2+ on the right side and 2+ on the left side.     Heart sounds: Normal heart sounds. No murmur heard. Pulmonary:     Effort: Pulmonary effort is normal. No tachypnea or respiratory distress.     Breath sounds: Normal breath sounds.  Chest:     Chest wall: No tenderness.  Abdominal:     General: Bowel sounds are normal.     Palpations: Abdomen is soft.  Musculoskeletal:     Right lower leg: No edema.     Left lower leg: No edema.  Skin:    General: Skin is warm and dry.     Capillary Refill: Capillary refill takes less than 2 seconds.  Neurological:     General: No focal deficit present.     Mental Status: He is alert and oriented to person, place, and time.  Psychiatric:        Mood and Affect: Mood normal.        Behavior: Behavior normal.     ED Results / Procedures / Treatments   Labs (all labs ordered are listed, but only abnormal results are displayed) Labs Reviewed  BASIC METABOLIC PANEL - Abnormal; Notable for the following components:      Result Value   Chloride 112 (*)    Anion gap 4 (*)    All other components within normal limits  CBC  HCG, SERUM, QUALITATIVE  D-DIMER, QUANTITATIVE  TROPONIN I (HIGH SENSITIVITY)  TROPONIN I (HIGH SENSITIVITY)    EKG None  Radiology DG Chest 2 View  Result Date: 11/18/2022 CLINICAL DATA:  Chest pain. EXAM: CHEST - 2 VIEW COMPARISON:  April 09, 2022. FINDINGS: The heart size and mediastinal contours are within normal limits. Both lungs are clear. The visualized skeletal structures are unremarkable. IMPRESSION: No active cardiopulmonary disease. Electronically Signed   By: Lupita Raider M.D.   On: 11/18/2022 12:41    Procedures Procedures    Medications Ordered in ED Medications - No data to display  ED Course/ Medical Decision Making/ A&P   {           HEART Score: 0                Medical Decision Making Amount and/or Complexity of Data Reviewed Labs: ordered. Radiology: ordered.  Initial Impression and Ddx 37 year old transgender female who presents to the emergency department with chest pain Patient PMH that increases complexity of ED  encounter:  gender dysphoria on hormone therapy, migraine, asthma Differential: Acute chest syndrome, stable angina, atypical angina, pulmonary embolism, pneumothorax, aortic dissection, pleural effusion, CHF, COPD, asthma, myocarditis, pericarditis, cardiac tamponade, chest wall pain   Interpretation of Diagnostics I independent reviewed and interpreted the labs as followed: troponin x1 negative, d-dimer negative  - I independently visualized the following imaging with scope of interpretation limited to determining acute life threatening conditions related to emergency care: CXR, which revealed no acute findings  Patient Reassessment and Ultimate Disposition/Management 37 year old transgender female who presents to the ER with chest pain, shortness of breath. Overall, well appearing, non-septic, non-toxic. Hemodynamically stable without hypoxia.  EKG without ischemia or infarction, troponin x1 negative, doubt ACS  Does not appear fluid volume overloaded on exam, no edema on CXR, doubt CHF exacerbation  Considered but doubt PE. PERC 0, Wells low risk so will defer d-dimer or CTA PE study at this time. CXR without evidence of pneumonia, pleural effusion or pneumothorax. No recent illnesses and troponin negative so doubt pericarditis or myocarditis  Symptoms inconsistent with aortic dissection  HEART Score: 0   No cough or fever concerning for a viral pneumonia or illness.  Unclear etiology of symptoms. Reassuring work up in the ER. Will discharge with PCP follow-up, return precautions  for worsening symptoms. He is agreeable to plan.  Patient management required discussion with the following services or consulting groups:  None  Complexity of Problems Addressed Acute complicated illness or Injury  Additional Data Reviewed and Analyzed Further history obtained from: Prior ED visit notes and Care Everywhere  Patient Encounter Risk Assessment SDOH impact on management  Final Clinical Impression(s) / ED Diagnoses Final diagnoses:  Atypical chest pain    Rx / DC Orders ED Discharge Orders     None         Cristopher Peru, PA-C 11/18/22 1520    Tegeler, Canary Brim, MD 11/18/22 1520

## 2022-11-18 NOTE — Telephone Encounter (Signed)
Pt called back no answer left a voice mail to call the office back  

## 2022-11-18 NOTE — Discharge Instructions (Signed)
You were seen in the ER for chest pain. All of your labs here, chest x-ray and EKG were reassuring. Please follow-up with your PCP in about 1 week for a recheck of symptoms. Please return for worsening symptoms.

## 2022-11-19 DIAGNOSIS — F3171 Bipolar disorder, in partial remission, most recent episode hypomanic: Secondary | ICD-10-CM | POA: Diagnosis not present

## 2022-11-21 ENCOUNTER — Encounter: Payer: Self-pay | Admitting: Neurology

## 2022-11-22 ENCOUNTER — Telehealth: Payer: Self-pay | Admitting: *Deleted

## 2022-11-22 DIAGNOSIS — F900 Attention-deficit hyperactivity disorder, predominantly inattentive type: Secondary | ICD-10-CM | POA: Diagnosis not present

## 2022-11-22 DIAGNOSIS — F649 Gender identity disorder, unspecified: Secondary | ICD-10-CM | POA: Diagnosis not present

## 2022-11-22 DIAGNOSIS — F411 Generalized anxiety disorder: Secondary | ICD-10-CM | POA: Diagnosis not present

## 2022-11-22 DIAGNOSIS — F319 Bipolar disorder, unspecified: Secondary | ICD-10-CM | POA: Diagnosis not present

## 2022-11-23 DIAGNOSIS — R5383 Other fatigue: Secondary | ICD-10-CM | POA: Diagnosis not present

## 2022-11-23 DIAGNOSIS — R599 Enlarged lymph nodes, unspecified: Secondary | ICD-10-CM | POA: Diagnosis not present

## 2022-11-24 DIAGNOSIS — R102 Pelvic and perineal pain: Secondary | ICD-10-CM | POA: Diagnosis not present

## 2022-11-26 DIAGNOSIS — Z5986 Financial insecurity: Secondary | ICD-10-CM | POA: Diagnosis not present

## 2022-11-26 DIAGNOSIS — F419 Anxiety disorder, unspecified: Secondary | ICD-10-CM | POA: Diagnosis not present

## 2022-11-26 DIAGNOSIS — F3170 Bipolar disorder, currently in remission, most recent episode unspecified: Secondary | ICD-10-CM | POA: Diagnosis not present

## 2022-11-26 DIAGNOSIS — G4700 Insomnia, unspecified: Secondary | ICD-10-CM | POA: Diagnosis not present

## 2022-11-26 DIAGNOSIS — Z79890 Hormone replacement therapy (postmenopausal): Secondary | ICD-10-CM | POA: Diagnosis not present

## 2022-11-26 DIAGNOSIS — R45851 Suicidal ideations: Secondary | ICD-10-CM | POA: Diagnosis not present

## 2022-11-26 DIAGNOSIS — F319 Bipolar disorder, unspecified: Secondary | ICD-10-CM | POA: Diagnosis not present

## 2022-11-26 DIAGNOSIS — F4310 Post-traumatic stress disorder, unspecified: Secondary | ICD-10-CM | POA: Diagnosis not present

## 2022-11-26 DIAGNOSIS — F3112 Bipolar disorder, current episode manic without psychotic features, moderate: Secondary | ICD-10-CM | POA: Diagnosis not present

## 2022-11-26 DIAGNOSIS — F39 Unspecified mood [affective] disorder: Secondary | ICD-10-CM | POA: Diagnosis not present

## 2022-11-26 DIAGNOSIS — F3171 Bipolar disorder, in partial remission, most recent episode hypomanic: Secondary | ICD-10-CM | POA: Diagnosis not present

## 2022-11-26 DIAGNOSIS — Z5941 Food insecurity: Secondary | ICD-10-CM | POA: Diagnosis not present

## 2022-11-26 DIAGNOSIS — Z1152 Encounter for screening for COVID-19: Secondary | ICD-10-CM | POA: Diagnosis not present

## 2022-11-26 DIAGNOSIS — F909 Attention-deficit hyperactivity disorder, unspecified type: Secondary | ICD-10-CM | POA: Diagnosis not present

## 2022-11-26 DIAGNOSIS — Z5901 Sheltered homelessness: Secondary | ICD-10-CM | POA: Diagnosis not present

## 2022-11-26 DIAGNOSIS — G479 Sleep disorder, unspecified: Secondary | ICD-10-CM | POA: Diagnosis not present

## 2022-12-02 ENCOUNTER — Ambulatory Visit: Payer: BC Managed Care – PPO | Admitting: Cardiology

## 2022-12-07 DIAGNOSIS — H01004 Unspecified blepharitis left upper eyelid: Secondary | ICD-10-CM | POA: Diagnosis not present

## 2022-12-07 DIAGNOSIS — N6452 Nipple discharge: Secondary | ICD-10-CM | POA: Diagnosis not present

## 2022-12-07 DIAGNOSIS — H01002 Unspecified blepharitis right lower eyelid: Secondary | ICD-10-CM | POA: Diagnosis not present

## 2022-12-07 DIAGNOSIS — F900 Attention-deficit hyperactivity disorder, predominantly inattentive type: Secondary | ICD-10-CM | POA: Diagnosis not present

## 2022-12-07 DIAGNOSIS — H01001 Unspecified blepharitis right upper eyelid: Secondary | ICD-10-CM | POA: Diagnosis not present

## 2022-12-07 DIAGNOSIS — F411 Generalized anxiety disorder: Secondary | ICD-10-CM | POA: Diagnosis not present

## 2022-12-07 DIAGNOSIS — F3112 Bipolar disorder, current episode manic without psychotic features, moderate: Secondary | ICD-10-CM | POA: Diagnosis not present

## 2022-12-07 DIAGNOSIS — F3171 Bipolar disorder, in partial remission, most recent episode hypomanic: Secondary | ICD-10-CM | POA: Diagnosis not present

## 2022-12-07 DIAGNOSIS — F39 Unspecified mood [affective] disorder: Secondary | ICD-10-CM | POA: Diagnosis not present

## 2022-12-07 DIAGNOSIS — F310 Bipolar disorder, current episode hypomanic: Secondary | ICD-10-CM | POA: Diagnosis not present

## 2022-12-07 DIAGNOSIS — F319 Bipolar disorder, unspecified: Secondary | ICD-10-CM | POA: Diagnosis not present

## 2022-12-07 DIAGNOSIS — F649 Gender identity disorder, unspecified: Secondary | ICD-10-CM | POA: Diagnosis not present

## 2022-12-07 DIAGNOSIS — R871 Abnormal level of hormones in specimens from female genital organs: Secondary | ICD-10-CM | POA: Diagnosis not present

## 2022-12-07 DIAGNOSIS — F3110 Bipolar disorder, current episode manic without psychotic features, unspecified: Secondary | ICD-10-CM | POA: Diagnosis not present

## 2022-12-07 DIAGNOSIS — H01005 Unspecified blepharitis left lower eyelid: Secondary | ICD-10-CM | POA: Diagnosis not present

## 2022-12-07 DIAGNOSIS — Z022 Encounter for examination for admission to residential institution: Secondary | ICD-10-CM | POA: Diagnosis not present

## 2022-12-27 ENCOUNTER — Ambulatory Visit: Payer: BC Managed Care – PPO | Admitting: Cardiology

## 2023-01-05 IMAGING — CT CT PARANASAL SINUSES LIMITED
2 of 4 series · 16 of 22 positions shown, 18 images · non-contrast
Comparison: None.

CLINICAL DATA: persistent cough

EXAM:
CT PARANASAL SINUS LIMITED WITHOUT CONTRAST
TECHNIQUE: Non-contiguous multidetector CT images of the paranasal sinuses were
obtained in a single plane without contrast.
RADIATION DOSE REDUCTION: This exam was performed according to the
departmental dose-optimization program which includes automated
exposure control, adjustment of the mA and/or kV according to
patient size and/or use of iterative reconstruction technique.

[Series 6: limited sinus bone · axial · 0.30mm/px · z∈[+139,+209]mm · 8 of 10 slices shown, 10 images]
[im 2/10  brain]
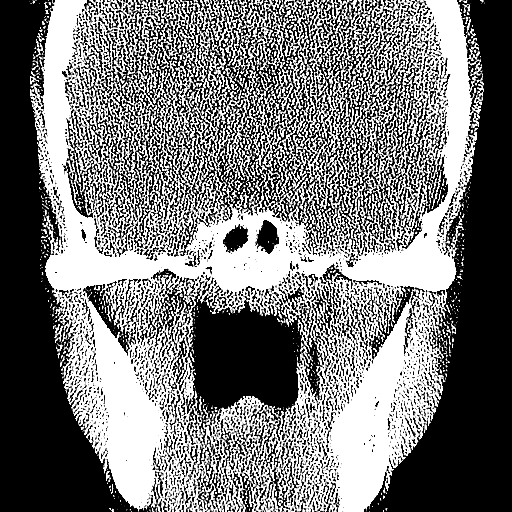
[im 2/10  bone]
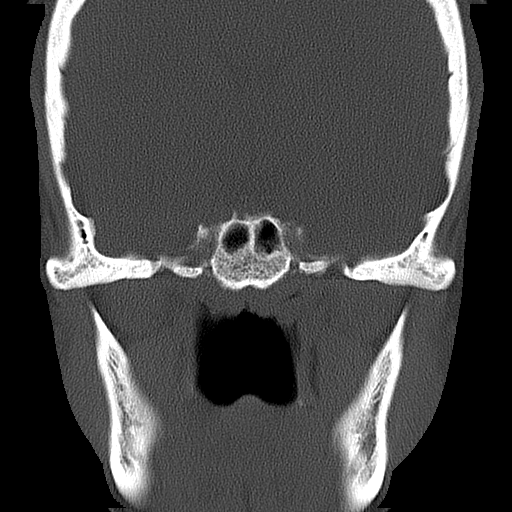
[im 3/10  bone]
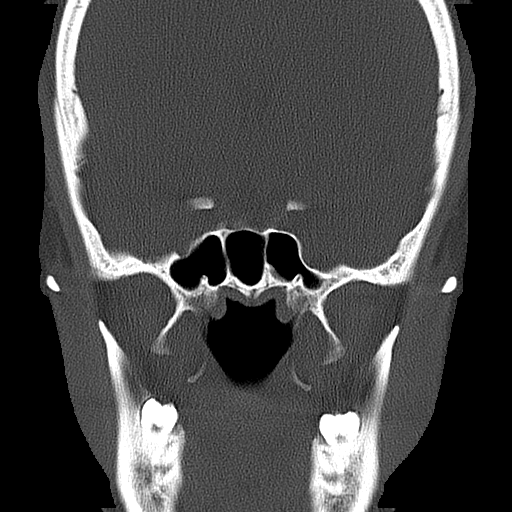
[im 4/10  bone]
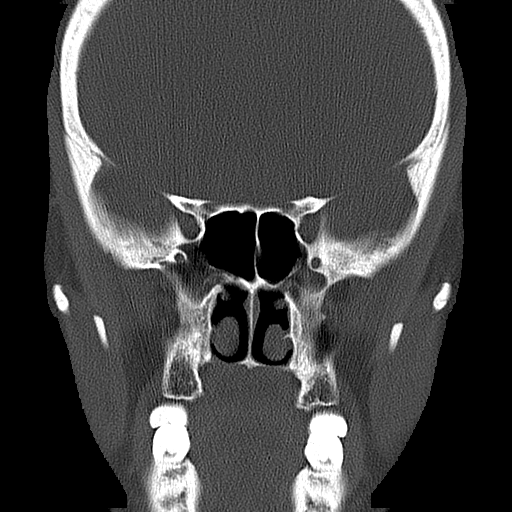
[im 5/10  bone]
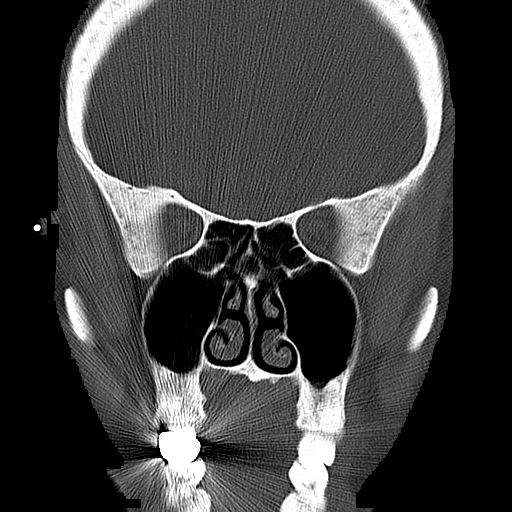
[im 6/10  brain]
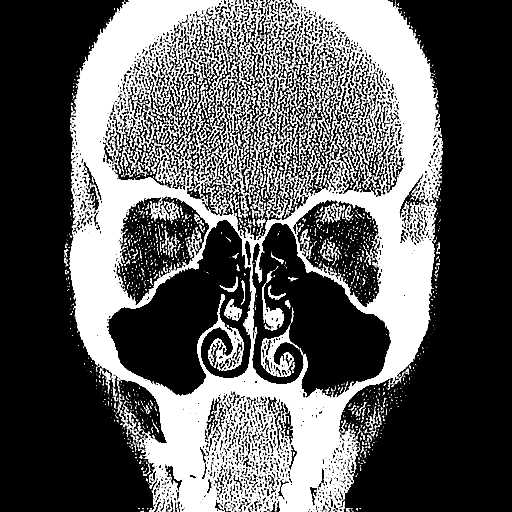
[im 6/10  bone]
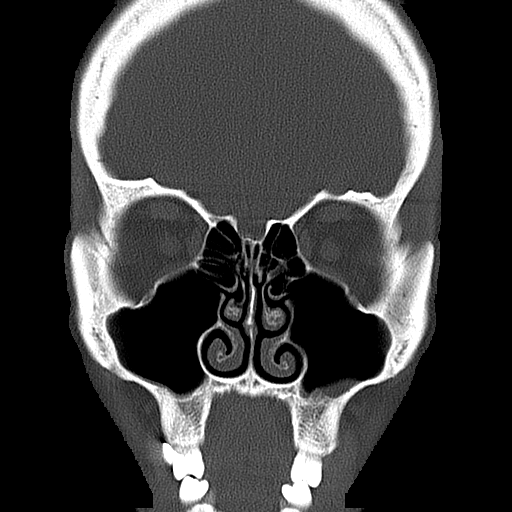
[im 7/10  bone]
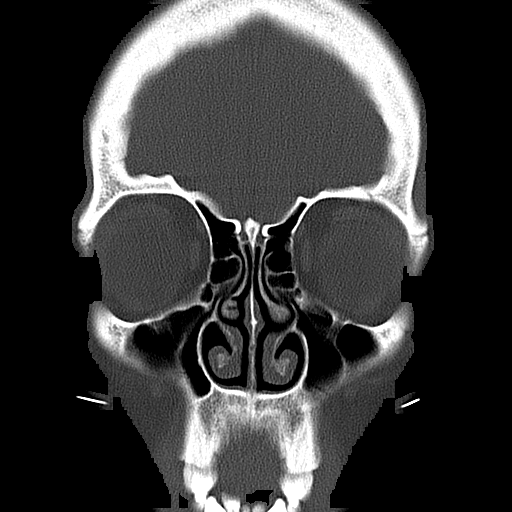
[im 8/10  bone]
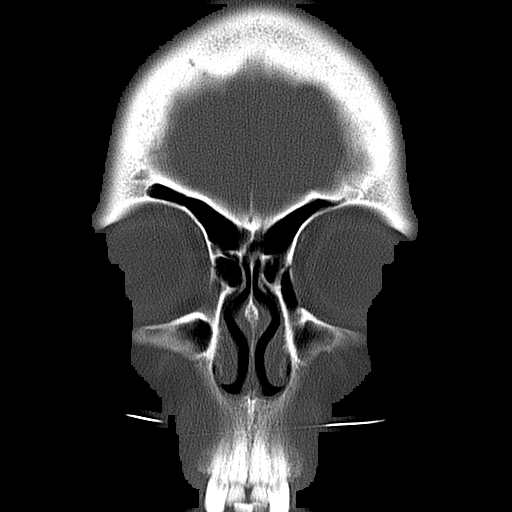
[im 9/10  bone]
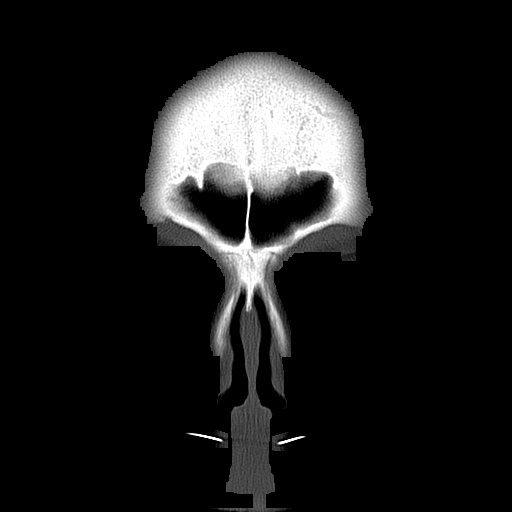

[Series 7: limited sinus st · axial · 0.30mm/px · z∈[+139,+209]mm · 8 of 10 slices shown]
[im 2/10  bone]
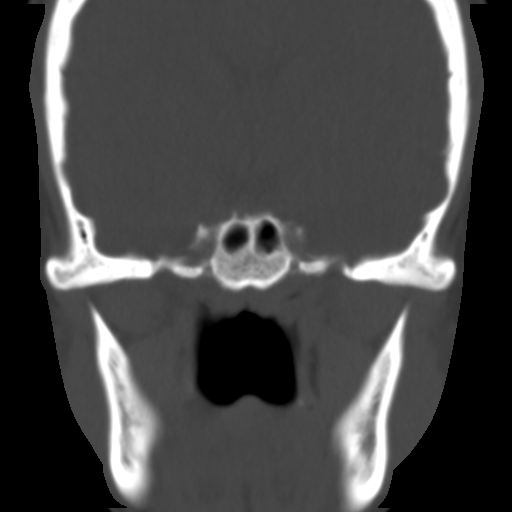
[im 3/10  bone]
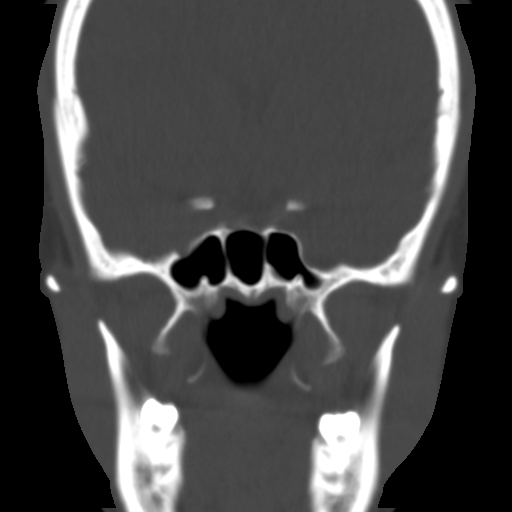
[im 4/10  bone]
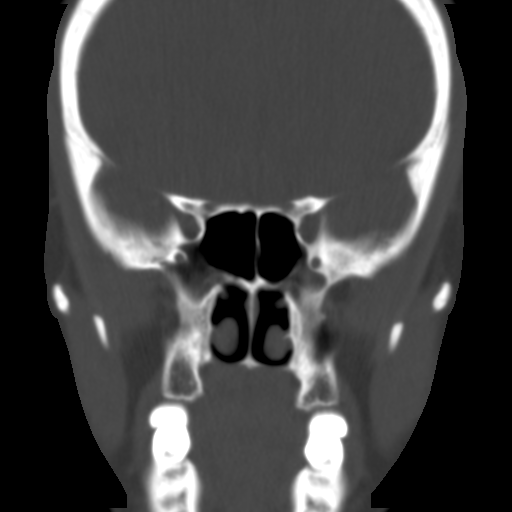
[im 5/10  bone]
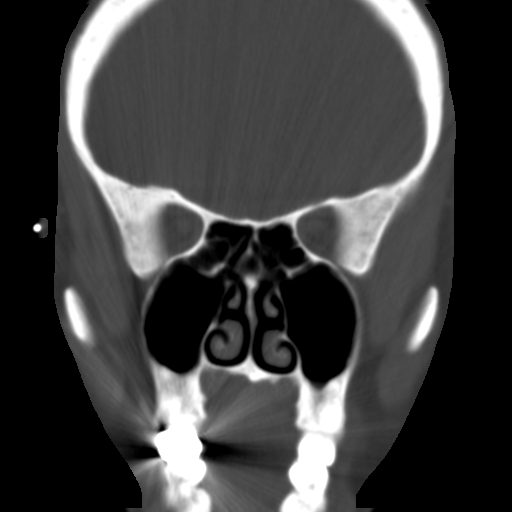
[im 6/10  bone]
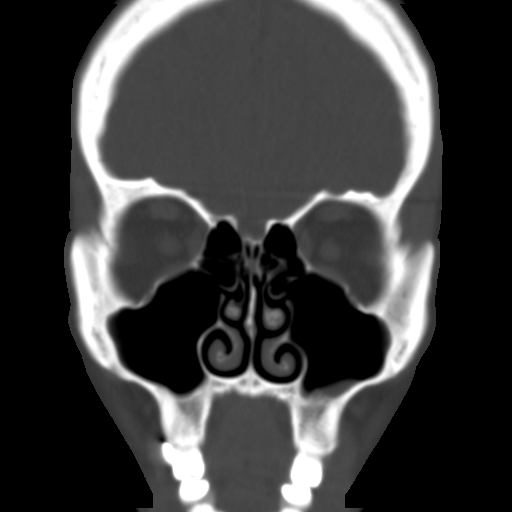
[im 7/10  bone]
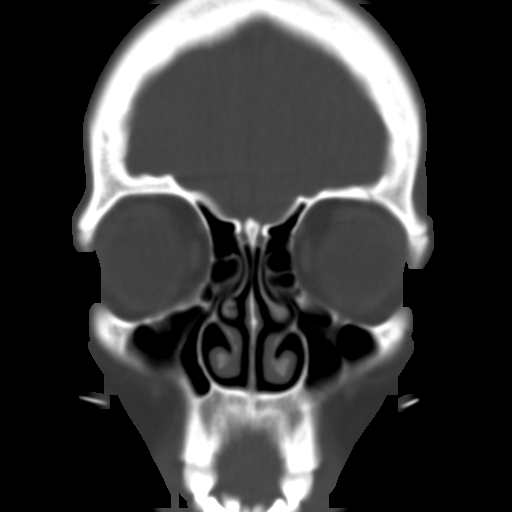
[im 8/10  bone]
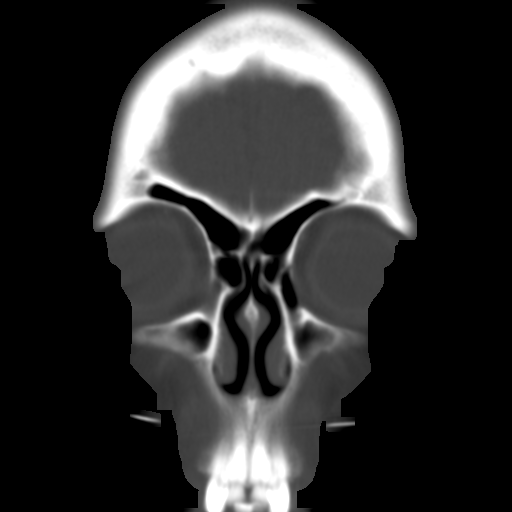
[im 9/10  bone]
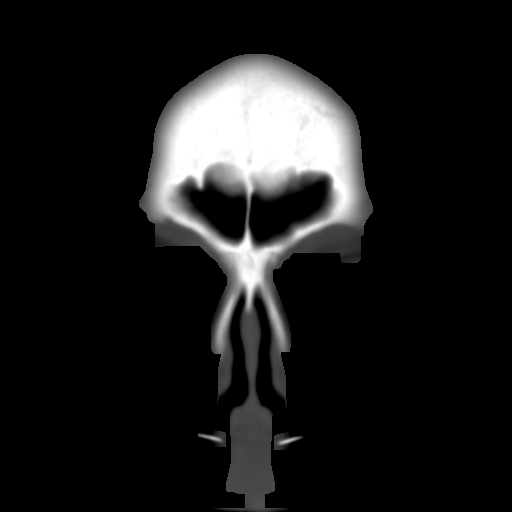

[16 of 22 positions shown; findings below may reference images not displayed]

FINDINGS: Mild mucosal thickening in the inferior maxillary sinuses.
Otherwise, normally aerated visualized sinuses on this limited CT.
Nasal cavity appears patent. Unremarkable appearance of the orbits
on limited assessment.
IMPRESSION: No substantial paranasal sinus disease.

## 2023-01-10 ENCOUNTER — Ambulatory Visit: Payer: BC Managed Care – PPO | Admitting: Cardiology

## 2023-01-11 DIAGNOSIS — H01002 Unspecified blepharitis right lower eyelid: Secondary | ICD-10-CM | POA: Diagnosis not present

## 2023-01-11 DIAGNOSIS — H01001 Unspecified blepharitis right upper eyelid: Secondary | ICD-10-CM | POA: Diagnosis not present

## 2023-01-11 DIAGNOSIS — H01004 Unspecified blepharitis left upper eyelid: Secondary | ICD-10-CM | POA: Diagnosis not present

## 2023-01-11 DIAGNOSIS — H01005 Unspecified blepharitis left lower eyelid: Secondary | ICD-10-CM | POA: Diagnosis not present

## 2023-01-21 DIAGNOSIS — R10813 Right lower quadrant abdominal tenderness: Secondary | ICD-10-CM | POA: Diagnosis not present

## 2023-01-21 DIAGNOSIS — R1031 Right lower quadrant pain: Secondary | ICD-10-CM | POA: Diagnosis not present

## 2023-01-24 DIAGNOSIS — F640 Transsexualism: Secondary | ICD-10-CM | POA: Diagnosis not present

## 2023-01-24 DIAGNOSIS — Z79899 Other long term (current) drug therapy: Secondary | ICD-10-CM | POA: Diagnosis not present

## 2023-01-24 DIAGNOSIS — R1031 Right lower quadrant pain: Secondary | ICD-10-CM | POA: Diagnosis not present

## 2023-01-24 DIAGNOSIS — F649 Gender identity disorder, unspecified: Secondary | ICD-10-CM | POA: Diagnosis not present

## 2023-01-27 DIAGNOSIS — F3171 Bipolar disorder, in partial remission, most recent episode hypomanic: Secondary | ICD-10-CM | POA: Diagnosis not present

## 2023-02-02 DIAGNOSIS — F649 Gender identity disorder, unspecified: Secondary | ICD-10-CM | POA: Diagnosis not present

## 2023-02-02 DIAGNOSIS — Z113 Encounter for screening for infections with a predominantly sexual mode of transmission: Secondary | ICD-10-CM | POA: Diagnosis not present

## 2023-02-02 DIAGNOSIS — E349 Endocrine disorder, unspecified: Secondary | ICD-10-CM | POA: Diagnosis not present

## 2023-02-02 DIAGNOSIS — Z79899 Other long term (current) drug therapy: Secondary | ICD-10-CM | POA: Diagnosis not present

## 2023-02-03 DIAGNOSIS — H01004 Unspecified blepharitis left upper eyelid: Secondary | ICD-10-CM | POA: Diagnosis not present

## 2023-02-03 DIAGNOSIS — F3171 Bipolar disorder, in partial remission, most recent episode hypomanic: Secondary | ICD-10-CM | POA: Diagnosis not present

## 2023-02-03 DIAGNOSIS — H01001 Unspecified blepharitis right upper eyelid: Secondary | ICD-10-CM | POA: Diagnosis not present

## 2023-02-03 DIAGNOSIS — H01002 Unspecified blepharitis right lower eyelid: Secondary | ICD-10-CM | POA: Diagnosis not present

## 2023-02-03 DIAGNOSIS — H01005 Unspecified blepharitis left lower eyelid: Secondary | ICD-10-CM | POA: Diagnosis not present

## 2023-02-09 DIAGNOSIS — F312 Bipolar disorder, current episode manic severe with psychotic features: Secondary | ICD-10-CM | POA: Diagnosis not present

## 2023-02-10 DIAGNOSIS — F312 Bipolar disorder, current episode manic severe with psychotic features: Secondary | ICD-10-CM | POA: Diagnosis not present

## 2023-02-11 DIAGNOSIS — F312 Bipolar disorder, current episode manic severe with psychotic features: Secondary | ICD-10-CM | POA: Diagnosis not present

## 2023-02-14 DIAGNOSIS — F312 Bipolar disorder, current episode manic severe with psychotic features: Secondary | ICD-10-CM | POA: Diagnosis not present

## 2023-02-15 DIAGNOSIS — F840 Autistic disorder: Secondary | ICD-10-CM | POA: Diagnosis not present

## 2023-02-15 DIAGNOSIS — J45909 Unspecified asthma, uncomplicated: Secondary | ICD-10-CM | POA: Diagnosis not present

## 2023-02-15 DIAGNOSIS — Z560 Unemployment, unspecified: Secondary | ICD-10-CM | POA: Diagnosis not present

## 2023-02-15 DIAGNOSIS — Z91410 Personal history of adult physical and sexual abuse: Secondary | ICD-10-CM | POA: Diagnosis not present

## 2023-02-15 DIAGNOSIS — F603 Borderline personality disorder: Secondary | ICD-10-CM | POA: Diagnosis not present

## 2023-02-15 DIAGNOSIS — R4587 Impulsiveness: Secondary | ICD-10-CM | POA: Diagnosis not present

## 2023-02-15 DIAGNOSIS — F4310 Post-traumatic stress disorder, unspecified: Secondary | ICD-10-CM | POA: Diagnosis not present

## 2023-02-15 DIAGNOSIS — F649 Gender identity disorder, unspecified: Secondary | ICD-10-CM | POA: Diagnosis not present

## 2023-02-15 DIAGNOSIS — F3163 Bipolar disorder, current episode mixed, severe, without psychotic features: Secondary | ICD-10-CM | POA: Diagnosis not present

## 2023-02-15 DIAGNOSIS — R45851 Suicidal ideations: Secondary | ICD-10-CM | POA: Diagnosis not present

## 2023-02-15 DIAGNOSIS — F640 Transsexualism: Secondary | ICD-10-CM | POA: Diagnosis not present

## 2023-02-15 DIAGNOSIS — F319 Bipolar disorder, unspecified: Secondary | ICD-10-CM | POA: Diagnosis not present

## 2023-02-15 DIAGNOSIS — F322 Major depressive disorder, single episode, severe without psychotic features: Secondary | ICD-10-CM | POA: Diagnosis not present

## 2023-02-15 DIAGNOSIS — K219 Gastro-esophageal reflux disease without esophagitis: Secondary | ICD-10-CM | POA: Diagnosis not present

## 2023-02-15 DIAGNOSIS — J302 Other seasonal allergic rhinitis: Secondary | ICD-10-CM | POA: Diagnosis not present

## 2023-02-15 DIAGNOSIS — R569 Unspecified convulsions: Secondary | ICD-10-CM | POA: Diagnosis not present

## 2023-02-15 DIAGNOSIS — Z9152 Personal history of nonsuicidal self-harm: Secondary | ICD-10-CM | POA: Diagnosis not present

## 2023-02-15 DIAGNOSIS — F419 Anxiety disorder, unspecified: Secondary | ICD-10-CM | POA: Diagnosis not present

## 2023-02-15 DIAGNOSIS — I10 Essential (primary) hypertension: Secondary | ICD-10-CM | POA: Diagnosis not present

## 2023-02-16 ENCOUNTER — Ambulatory Visit: Payer: BC Managed Care – PPO | Admitting: Cardiology

## 2023-02-21 DIAGNOSIS — F319 Bipolar disorder, unspecified: Secondary | ICD-10-CM | POA: Diagnosis not present

## 2023-02-21 DIAGNOSIS — F322 Major depressive disorder, single episode, severe without psychotic features: Secondary | ICD-10-CM | POA: Diagnosis not present

## 2023-02-22 DIAGNOSIS — F319 Bipolar disorder, unspecified: Secondary | ICD-10-CM | POA: Diagnosis not present

## 2023-02-22 DIAGNOSIS — F322 Major depressive disorder, single episode, severe without psychotic features: Secondary | ICD-10-CM | POA: Diagnosis not present

## 2023-02-23 DIAGNOSIS — F319 Bipolar disorder, unspecified: Secondary | ICD-10-CM | POA: Diagnosis not present

## 2023-02-23 DIAGNOSIS — F322 Major depressive disorder, single episode, severe without psychotic features: Secondary | ICD-10-CM | POA: Diagnosis not present

## 2023-02-24 DIAGNOSIS — F319 Bipolar disorder, unspecified: Secondary | ICD-10-CM | POA: Diagnosis not present

## 2023-02-24 DIAGNOSIS — F322 Major depressive disorder, single episode, severe without psychotic features: Secondary | ICD-10-CM | POA: Diagnosis not present

## 2023-02-25 DIAGNOSIS — F322 Major depressive disorder, single episode, severe without psychotic features: Secondary | ICD-10-CM | POA: Diagnosis not present

## 2023-02-25 DIAGNOSIS — F319 Bipolar disorder, unspecified: Secondary | ICD-10-CM | POA: Diagnosis not present

## 2023-02-28 DIAGNOSIS — F319 Bipolar disorder, unspecified: Secondary | ICD-10-CM | POA: Diagnosis not present

## 2023-03-01 DIAGNOSIS — F319 Bipolar disorder, unspecified: Secondary | ICD-10-CM | POA: Diagnosis not present

## 2023-03-03 DIAGNOSIS — B349 Viral infection, unspecified: Secondary | ICD-10-CM | POA: Diagnosis not present

## 2023-03-07 DIAGNOSIS — F319 Bipolar disorder, unspecified: Secondary | ICD-10-CM | POA: Diagnosis not present

## 2023-03-10 DIAGNOSIS — F319 Bipolar disorder, unspecified: Secondary | ICD-10-CM | POA: Diagnosis not present

## 2023-03-11 DIAGNOSIS — F319 Bipolar disorder, unspecified: Secondary | ICD-10-CM | POA: Diagnosis not present

## 2023-03-14 DIAGNOSIS — F319 Bipolar disorder, unspecified: Secondary | ICD-10-CM | POA: Diagnosis not present

## 2023-03-15 DIAGNOSIS — F319 Bipolar disorder, unspecified: Secondary | ICD-10-CM | POA: Diagnosis not present

## 2023-03-17 DIAGNOSIS — F319 Bipolar disorder, unspecified: Secondary | ICD-10-CM | POA: Diagnosis not present

## 2023-03-21 DIAGNOSIS — F319 Bipolar disorder, unspecified: Secondary | ICD-10-CM | POA: Diagnosis not present

## 2023-03-22 DIAGNOSIS — F319 Bipolar disorder, unspecified: Secondary | ICD-10-CM | POA: Diagnosis not present

## 2023-03-23 DIAGNOSIS — F319 Bipolar disorder, unspecified: Secondary | ICD-10-CM | POA: Diagnosis not present

## 2023-03-24 DIAGNOSIS — F319 Bipolar disorder, unspecified: Secondary | ICD-10-CM | POA: Diagnosis not present

## 2023-03-25 DIAGNOSIS — F319 Bipolar disorder, unspecified: Secondary | ICD-10-CM | POA: Diagnosis not present

## 2023-03-28 DIAGNOSIS — F319 Bipolar disorder, unspecified: Secondary | ICD-10-CM | POA: Diagnosis not present

## 2023-03-29 DIAGNOSIS — F319 Bipolar disorder, unspecified: Secondary | ICD-10-CM | POA: Diagnosis not present

## 2023-03-30 DIAGNOSIS — F319 Bipolar disorder, unspecified: Secondary | ICD-10-CM | POA: Diagnosis not present

## 2023-04-01 DIAGNOSIS — F319 Bipolar disorder, unspecified: Secondary | ICD-10-CM | POA: Diagnosis not present

## 2023-04-04 DIAGNOSIS — F3171 Bipolar disorder, in partial remission, most recent episode hypomanic: Secondary | ICD-10-CM | POA: Diagnosis not present

## 2023-04-05 DIAGNOSIS — F319 Bipolar disorder, unspecified: Secondary | ICD-10-CM | POA: Diagnosis not present

## 2023-04-06 DIAGNOSIS — F319 Bipolar disorder, unspecified: Secondary | ICD-10-CM | POA: Diagnosis not present

## 2023-04-07 DIAGNOSIS — F319 Bipolar disorder, unspecified: Secondary | ICD-10-CM | POA: Diagnosis not present

## 2023-04-11 DIAGNOSIS — F319 Bipolar disorder, unspecified: Secondary | ICD-10-CM | POA: Diagnosis not present

## 2023-04-14 DIAGNOSIS — F319 Bipolar disorder, unspecified: Secondary | ICD-10-CM | POA: Diagnosis not present

## 2023-04-15 DIAGNOSIS — F319 Bipolar disorder, unspecified: Secondary | ICD-10-CM | POA: Diagnosis not present

## 2023-04-18 DIAGNOSIS — F319 Bipolar disorder, unspecified: Secondary | ICD-10-CM | POA: Diagnosis not present

## 2023-04-19 DIAGNOSIS — F319 Bipolar disorder, unspecified: Secondary | ICD-10-CM | POA: Diagnosis not present

## 2023-04-19 DIAGNOSIS — F3171 Bipolar disorder, in partial remission, most recent episode hypomanic: Secondary | ICD-10-CM | POA: Diagnosis not present

## 2023-04-20 DIAGNOSIS — F5104 Psychophysiologic insomnia: Secondary | ICD-10-CM | POA: Diagnosis not present

## 2023-04-21 DIAGNOSIS — F319 Bipolar disorder, unspecified: Secondary | ICD-10-CM | POA: Diagnosis not present

## 2023-04-22 DIAGNOSIS — F319 Bipolar disorder, unspecified: Secondary | ICD-10-CM | POA: Diagnosis not present

## 2023-04-26 DIAGNOSIS — F909 Attention-deficit hyperactivity disorder, unspecified type: Secondary | ICD-10-CM | POA: Diagnosis not present

## 2023-04-26 DIAGNOSIS — F603 Borderline personality disorder: Secondary | ICD-10-CM | POA: Diagnosis not present

## 2023-04-26 DIAGNOSIS — F3162 Bipolar disorder, current episode mixed, moderate: Secondary | ICD-10-CM | POA: Diagnosis not present

## 2023-04-29 DIAGNOSIS — F319 Bipolar disorder, unspecified: Secondary | ICD-10-CM | POA: Diagnosis not present

## 2023-05-06 ENCOUNTER — Telehealth: Payer: Self-pay | Admitting: Neurology

## 2023-05-06 NOTE — Telephone Encounter (Signed)
 Pt called in stating he has been working with his PCP on some testing and his PCP felt this one test would be best to have his neurologist to do it since it'll need some interpretation. The test is a blood test looking for Fatal Familial Insomnia. He is wondering if Dr. Georjean could order this type of test?

## 2023-05-10 NOTE — Telephone Encounter (Signed)
 Pt called an informed Sorry, this is not something we order, if this is a concern, it has to be done through a sleep specialist who has done a sleep study first. Pt verbalized understanding

## 2023-05-10 NOTE — Telephone Encounter (Signed)
 Sorry, this is not something we order, if this is a concern, it has to be done through a sleep specialist who has done a sleep study first.
